# Patient Record
Sex: Male | Born: 1950 | Race: White | Hispanic: No | Marital: Married | Smoking: Never smoker
Health system: Southern US, Community
[De-identification: ages and names within clinical notes are randomized; demographics above are authoritative.]

## PROBLEM LIST (undated history)

## (undated) DIAGNOSIS — G4700 Insomnia, unspecified: Secondary | ICD-10-CM

## (undated) DIAGNOSIS — Z860100 Personal history of colon polyps, unspecified: Secondary | ICD-10-CM

## (undated) DIAGNOSIS — R238 Other skin changes: Secondary | ICD-10-CM

## (undated) DIAGNOSIS — T4145XA Adverse effect of unspecified anesthetic, initial encounter: Secondary | ICD-10-CM

## (undated) DIAGNOSIS — L719 Rosacea, unspecified: Secondary | ICD-10-CM

## (undated) DIAGNOSIS — T8859XA Other complications of anesthesia, initial encounter: Secondary | ICD-10-CM

## (undated) DIAGNOSIS — M549 Dorsalgia, unspecified: Secondary | ICD-10-CM

## (undated) DIAGNOSIS — E119 Type 2 diabetes mellitus without complications: Secondary | ICD-10-CM

## (undated) DIAGNOSIS — G629 Polyneuropathy, unspecified: Secondary | ICD-10-CM

## (undated) DIAGNOSIS — M1990 Unspecified osteoarthritis, unspecified site: Secondary | ICD-10-CM

## (undated) DIAGNOSIS — R233 Spontaneous ecchymoses: Secondary | ICD-10-CM

## (undated) DIAGNOSIS — R200 Anesthesia of skin: Secondary | ICD-10-CM

## (undated) DIAGNOSIS — E785 Hyperlipidemia, unspecified: Secondary | ICD-10-CM

## (undated) DIAGNOSIS — G8929 Other chronic pain: Secondary | ICD-10-CM

## (undated) HISTORY — PX: OTHER SURGICAL HISTORY: SHX169

## (undated) HISTORY — PX: VERTEBRAL CORPECTOMY: SHX398

## (undated) HISTORY — PX: WRIST SURGERY: SHX841

## (undated) HISTORY — PX: ESOPHAGOGASTRODUODENOSCOPY: SHX1529

## (undated) HISTORY — PX: BACK SURGERY: SHX140

## (undated) HISTORY — PX: POSTERIOR CERVICAL FUSION/FORAMINOTOMY: SHX5038

## (undated) HISTORY — PX: ANKLE SURGERY: SHX546

## (undated) HISTORY — PX: KNEE ARTHROSCOPY: SUR90

## (undated) HISTORY — PX: COLONOSCOPY: SHX174

## (undated) HISTORY — PX: CARPAL TUNNEL RELEASE: SHX101

---

## 1998-12-24 ENCOUNTER — Ambulatory Visit (HOSPITAL_COMMUNITY): Admission: RE | Admit: 1998-12-24 | Discharge: 1998-12-24 | Payer: Self-pay | Admitting: Neurology

## 1998-12-24 ENCOUNTER — Encounter: Payer: Self-pay | Admitting: Neurology

## 1998-12-25 ENCOUNTER — Encounter: Payer: Self-pay | Admitting: Neurology

## 1998-12-25 ENCOUNTER — Ambulatory Visit (HOSPITAL_COMMUNITY): Admission: RE | Admit: 1998-12-25 | Discharge: 1998-12-25 | Payer: Self-pay | Admitting: Neurology

## 1999-11-16 ENCOUNTER — Encounter: Payer: Self-pay | Admitting: Neurology

## 1999-11-16 ENCOUNTER — Ambulatory Visit (HOSPITAL_COMMUNITY): Admission: RE | Admit: 1999-11-16 | Discharge: 1999-11-16 | Payer: Self-pay | Admitting: Neurology

## 2006-11-23 ENCOUNTER — Inpatient Hospital Stay (HOSPITAL_COMMUNITY): Admission: AD | Admit: 2006-11-23 | Discharge: 2006-11-24 | Payer: Self-pay | Admitting: Orthopedic Surgery

## 2007-05-31 ENCOUNTER — Encounter: Admission: RE | Admit: 2007-05-31 | Discharge: 2007-05-31 | Payer: Self-pay | Admitting: Orthopedic Surgery

## 2007-06-19 ENCOUNTER — Encounter: Admission: RE | Admit: 2007-06-19 | Discharge: 2007-06-19 | Payer: Self-pay | Admitting: Orthopedic Surgery

## 2007-08-25 ENCOUNTER — Encounter: Admission: RE | Admit: 2007-08-25 | Discharge: 2007-08-25 | Payer: Self-pay | Admitting: Orthopedic Surgery

## 2007-12-05 ENCOUNTER — Encounter: Admission: RE | Admit: 2007-12-05 | Discharge: 2007-12-05 | Payer: Self-pay | Admitting: Orthopedic Surgery

## 2007-12-26 ENCOUNTER — Inpatient Hospital Stay (HOSPITAL_COMMUNITY): Admission: RE | Admit: 2007-12-26 | Discharge: 2007-12-29 | Payer: Self-pay | Admitting: Neurosurgery

## 2008-04-11 ENCOUNTER — Encounter: Admission: RE | Admit: 2008-04-11 | Discharge: 2008-04-11 | Payer: Self-pay | Admitting: Neurosurgery

## 2008-07-05 ENCOUNTER — Encounter: Admission: RE | Admit: 2008-07-05 | Discharge: 2008-07-05 | Payer: Self-pay | Admitting: Neurosurgery

## 2008-10-29 ENCOUNTER — Encounter: Admission: RE | Admit: 2008-10-29 | Discharge: 2008-10-29 | Payer: Self-pay | Admitting: Neurosurgery

## 2009-06-02 ENCOUNTER — Ambulatory Visit (HOSPITAL_COMMUNITY): Admission: RE | Admit: 2009-06-02 | Discharge: 2009-06-02 | Payer: Self-pay | Admitting: Neurosurgery

## 2009-12-16 ENCOUNTER — Inpatient Hospital Stay (HOSPITAL_COMMUNITY): Admission: RE | Admit: 2009-12-16 | Discharge: 2009-12-20 | Payer: Self-pay | Admitting: Neurosurgery

## 2010-06-26 IMAGING — CT CT CERVICAL SPINE W/O CM
4 of 5 series · 15 of 33 positions shown, 18 images · non-contrast
Comparison: 04/11/2008.

CLINICAL DATA: 57-year-old male with neck pain status post fusion
in December 2007.  Occasional right arm pain.

CT CERVICAL SPINE WITHOUT CONTRAST
TECHNIQUE: Multidetector CT imaging of the cervical spine was
performed. Multiplanar CT image reconstructions were also generated

[Series 4: bone windows · axial · 0.23mm/px · z∈[+127,+190]mm · 2 of 152 slices shown]
[im 51/152  bone]
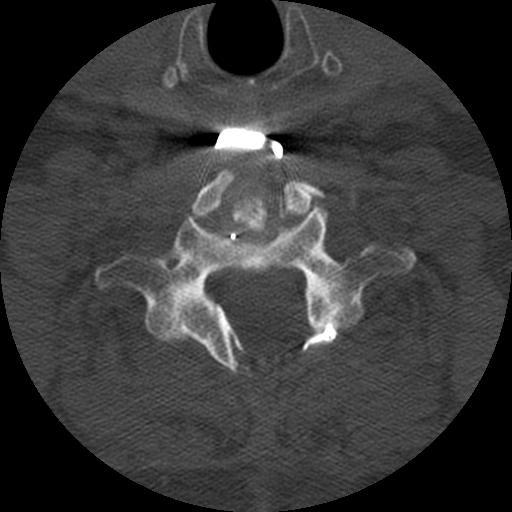
[im 101/152  bone]
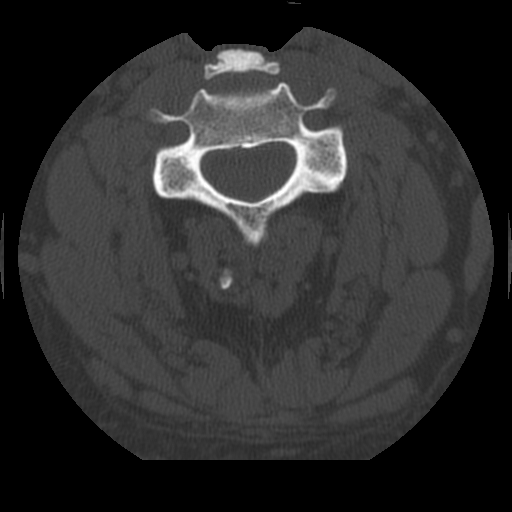

[Series 200: coronal · coronal · 0.38mm/px · 3 of 40 slices shown]
[im 8/40  bone]
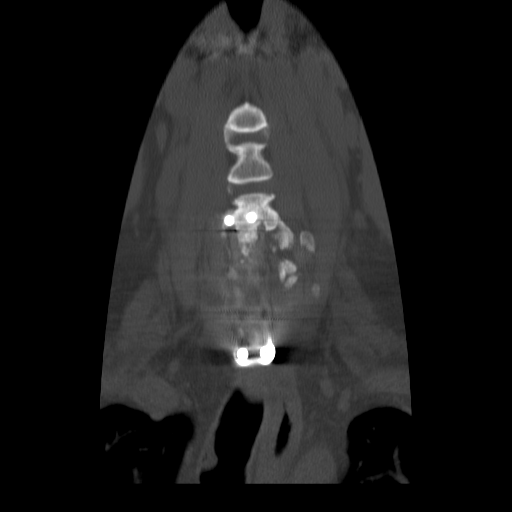
[im 16/40  bone]
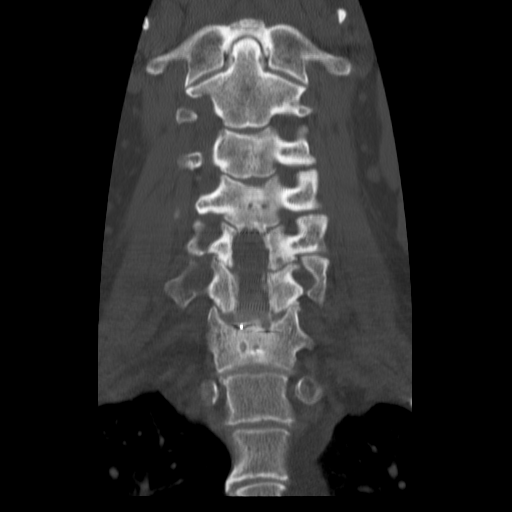
[im 24/40  bone]
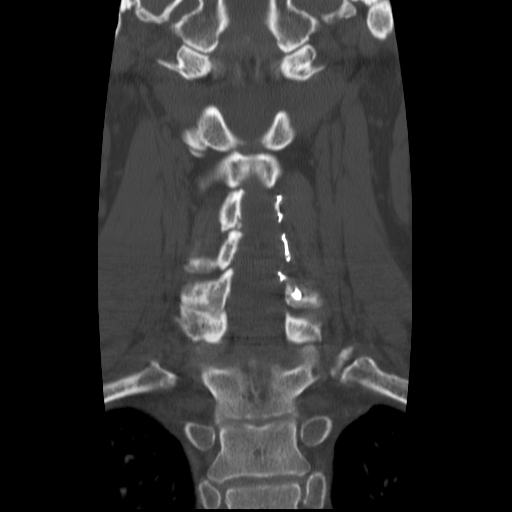

[Series 201: angled axial · axial · 0.20mm/px · z∈[+79,+199]mm · 5 of 272 slices shown, 7 images]
[im 46/272  soft-tissue]
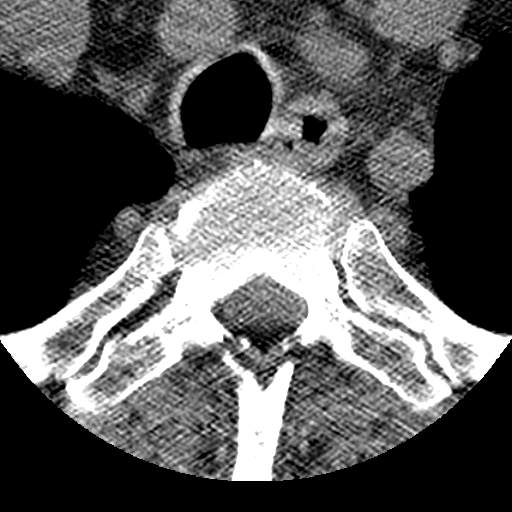
[im 46/272  bone]
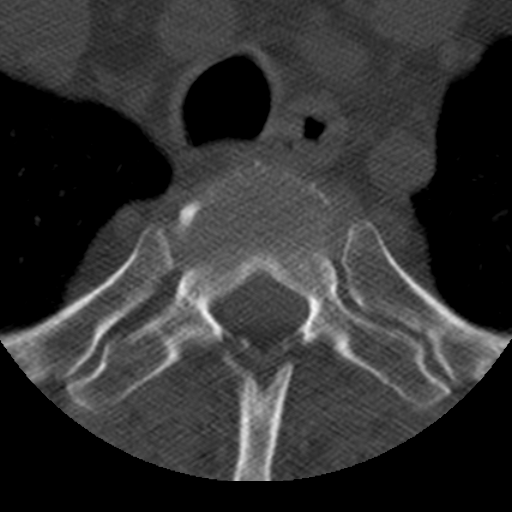
[im 91/272  bone]
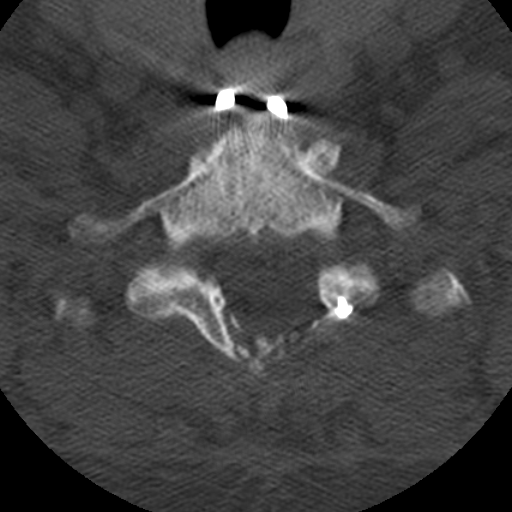
[im 136/272  bone]
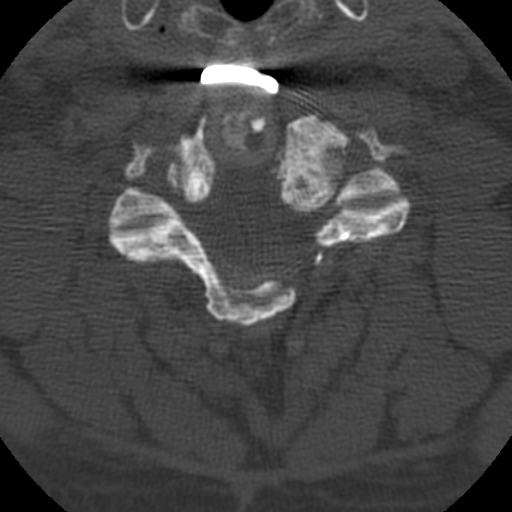
[im 181/272  bone]
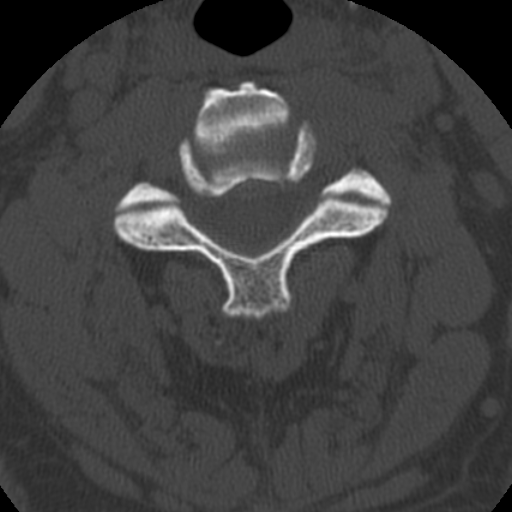
[im 226/272  soft-tissue]
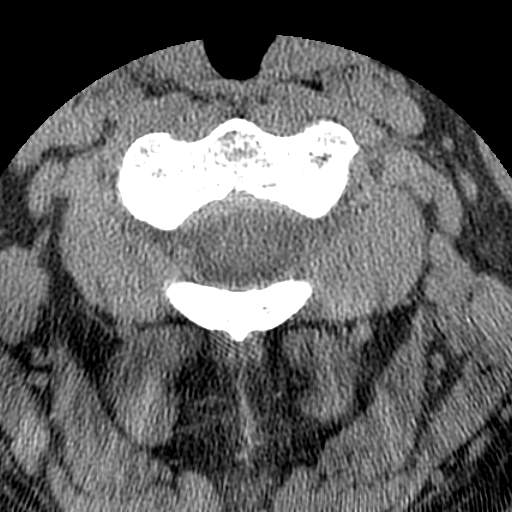
[im 226/272  bone]
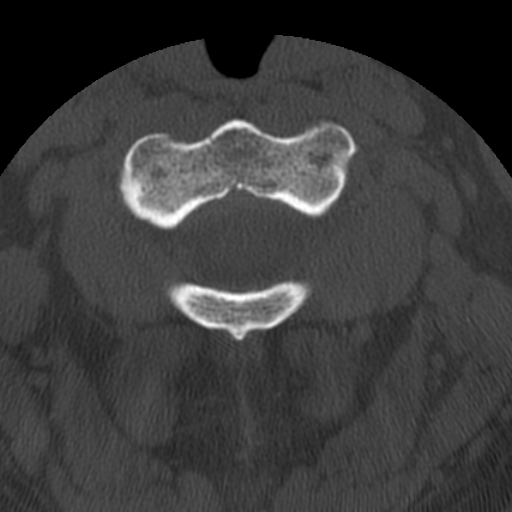

[Series 202: sagittal · sagittal · 0.38mm/px · 5 of 40 slices shown, 6 images]
[im 14/40  bone]
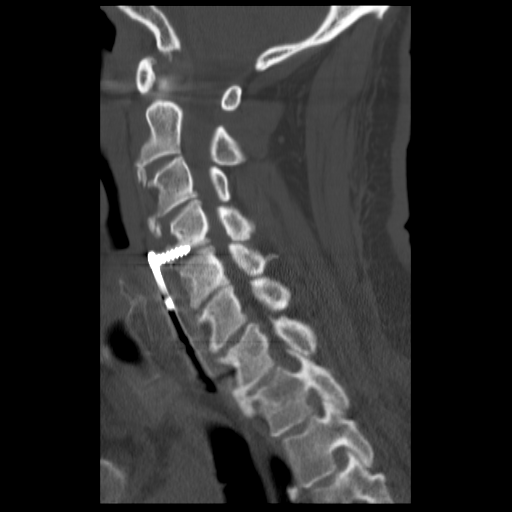
[im 17/40  bone]
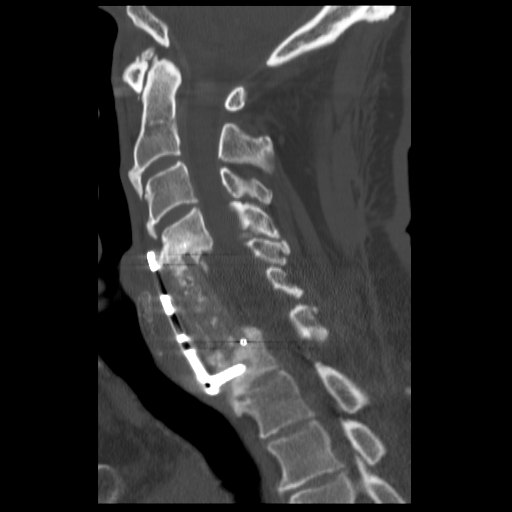
[im 20/40  soft-tissue]
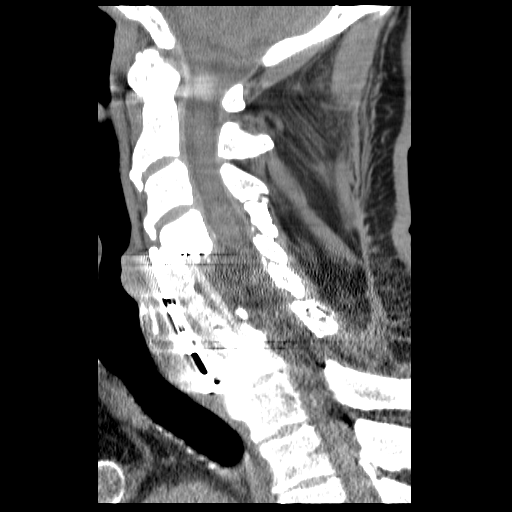
[im 20/40  bone]
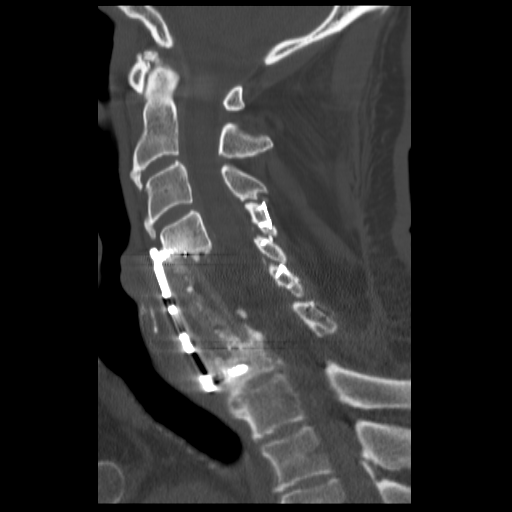
[im 23/40  bone]
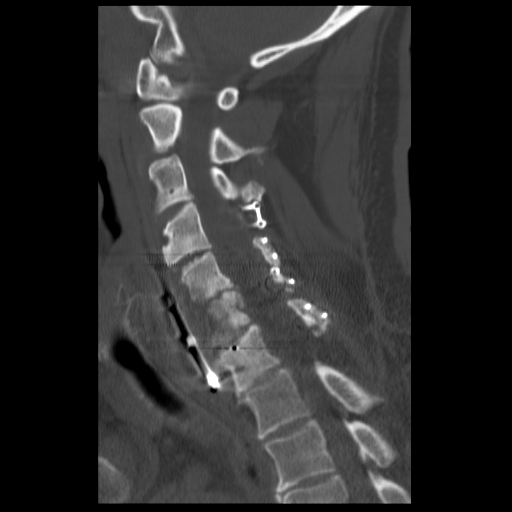
[im 27/40  bone]
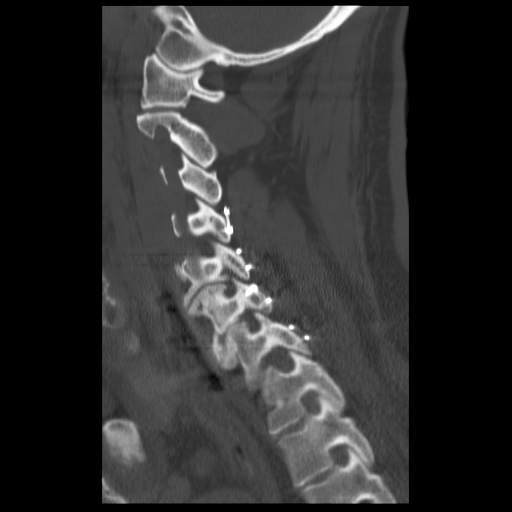

[15 of 33 positions shown; findings below may reference images not displayed]

FINDINGS: Sequelae of C5 and C6 corpectomy with anterior fusion
hardware C4-C7 re-identified.  Unchanged appearance of incompletely
bridging bone throughout the corpectomy space.  Stable alignment of
the cervicothoracic junction.  Stable posterior hardware placement
related to partial laminectomies from C3-C4 through C7.

Posterior elements are normally aligned and appear stable.
Visualized skull base appears intact with no atlanto-occipital
dissociation.  C1 and C2 are intact.  Postoperative changes to the
paraspinal soft tissues.  Visualized lung apices are clear.
Cervicothoracic junction is stable within normal limits.

C2-C3:  Stable disc protrusion, spinal canal and neural foraminal
patency without stenosis.

C3-C4:  Stable partial laminectomy.  Stable disc protrusion and
right greater than left uncovertebral hypertrophy.  Mild spinal
stenosis suspected (AP thecal sac 8.7 mm, unchanged).  Stable mild
right neural foraminal stenosis.

C4-C5:  Stable partial laminectomy.  Right C4 vertebral body
hardware appears stable and intact.  Right greater than left
uncovertebral hypertrophy re-identified with mild foraminal
stenosis.  Stable spinal canal patency.

C5-C6:  Status post corpectomy.  Stable moderate to severe left and
moderate right neural foraminal stenosis related to facet and
uncovertebral hypertrophy.  Stable spinal canal patency.

C6-C7:  Stable partial laminectomy corpectomy.  Stable mild
bilateral neural foraminal stenosis related to uncovertebral and
facet hypertrophy.  Stable C7 hardware with mild lucency at the
apex of both screws, right greater than left.

C7-T1:  Stable disc osteophyte complex and spinal canal patency.
Stable mild right foraminal stenosis related to facet hypertrophy.

T1-T2:  Stable, negative.

T2-T3:  Stable facet hypertrophy and ligament flavum hypertrophy
with calcification.  Mild bilateral, right greater than left neural
foraminal stenosis is stable.  Stable spinal canal patency.
IMPRESSION: 1.  Unchanged postoperative appearance of the cervical spine status
post C5, C6 corpectomy, C4 - C7 anterior fusion and partial
laminectomy with hardware C4-C5 through C6-C7 as detailed above.
2.  Unchanged lucency about the apex of the C7 cortical screws
right greater than left suggestive of loosening.
3.  Unchanged incomplete bridging bone throughout the corpectomy
space.

## 2010-09-24 ENCOUNTER — Encounter: Admission: RE | Admit: 2010-09-24 | Discharge: 2010-09-24 | Payer: Self-pay | Admitting: Neurosurgery

## 2010-12-27 ENCOUNTER — Encounter: Payer: Self-pay | Admitting: Orthopedic Surgery

## 2011-02-21 LAB — CBC
HCT: 44.2 % (ref 39.0–52.0)
Hemoglobin: 15 g/dL (ref 13.0–17.0)
MCHC: 34 g/dL (ref 30.0–36.0)
MCV: 87.3 fL (ref 78.0–100.0)
Platelets: 308 10*3/uL (ref 150–400)
RDW: 13.7 % (ref 11.5–15.5)
WBC: 9.6 10*3/uL (ref 4.0–10.5)

## 2011-02-21 LAB — GLUCOSE, CAPILLARY
Glucose-Capillary: 121 mg/dL — ABNORMAL HIGH (ref 70–99)
Glucose-Capillary: 131 mg/dL — ABNORMAL HIGH (ref 70–99)
Glucose-Capillary: 133 mg/dL — ABNORMAL HIGH (ref 70–99)
Glucose-Capillary: 134 mg/dL — ABNORMAL HIGH (ref 70–99)
Glucose-Capillary: 135 mg/dL — ABNORMAL HIGH (ref 70–99)
Glucose-Capillary: 139 mg/dL — ABNORMAL HIGH (ref 70–99)
Glucose-Capillary: 147 mg/dL — ABNORMAL HIGH (ref 70–99)
Glucose-Capillary: 151 mg/dL — ABNORMAL HIGH (ref 70–99)

## 2011-02-21 LAB — BASIC METABOLIC PANEL
BUN: 14 mg/dL (ref 6–23)
CO2: 28 mEq/L (ref 19–32)
Creatinine, Ser: 0.91 mg/dL (ref 0.4–1.5)
GFR calc Af Amer: >60 mL/min (ref >60)
Potassium: 4.7 mEq/L (ref 3.5–5.1)
Sodium: 137 mEq/L (ref 135–145)

## 2011-03-15 LAB — GLUCOSE, CAPILLARY
Glucose-Capillary: 145 mg/dL — ABNORMAL HIGH (ref 70–99)
Glucose-Capillary: 161 mg/dL — ABNORMAL HIGH (ref 70–99)

## 2011-04-20 NOTE — Op Note (Signed)
Jason Bernard, Jason Bernard             ACCOUNT NO.:  [id number]   MEDICAL RECORD NO.:  [id number]          PATIENT TYPE:  INP   LOCATION:  3101                         FACILITY:  [facility]   PHYSICIAN:  Danae Orleans. Venetia Maxon, M.D.  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   POSTOPERATIVE DIAGNOSIS:  Ossification of posterior longitudinal  ligament with cervical myelopathy, cervical stenosis and spondylosis  with herniated cervical disks at C4-5 through C6-7 levels.   PROCEDURE:  Anterior cervical corpectomy of C5 and C6 with 42 mm  anterior PEEK interbody corpectomy cage packed with morcellized bone  autograft and anterior cervical plate from Z6-X0 levels.   SURGEON:  Danae Orleans. Venetia Maxon, M.D.   ASSISTANT:  Georgiann Cocker, RN  Hilda Lias, M.D.   ANESTHESIA:  General endotracheal anesthesia.   BLOOD LOSS:  Three-hundred mL   COMPLICATIONS:  None.   DISPOSITION:  Recovery.   INDICATIONS:  Jason Bernard is a 60 year old man with significant  cervical myelopathy.  He has previously undergone a laminoplasty of C4-  C7 and has now developed a progressive significant cervical myelopathy  with evidence of cord compression on recent cervical myelography.  He  has ossification of posterior longitudinal ligament with compression of  spinal cord from C4-5 to C6-7 levels with a large osteophyte projecting  posteriorly into the spinal cord over the majority of C6 and also at C5-  6.  It was elected to take him to surgery for anterior cervical  corpectomy of C5 and C6 with anterior cervical plating C4-C7 levels.   PROCEDURE:  Jason Bernard is brought to the operating room.  Following  satisfactory and uncomplicated induction  of general endotracheal  anesthesia and placement of intravenous lines, the patient was placed in  supine position on the operating table.  His neck was placed in slight  extension.  He was placed in 10 pounds of halter traction.  His anterior  neck was then prepped and draped in the usual sterile fashion.  Incision  was made after injecting area of planned incision with 025% Marcaine and  0.5% lidocaine with 1:100,000 epinephrine for midline to the anterior  border sternocleidomastoid muscle and in the lowest neck crease.  This  was carried through platysma layer.  Subplatysmal dissection was  performed exposing the anterior border of the sternocleidomastoid  muscle.  Using blunt dissection the carotid sheath was kept lateral and  trachea and esophagus kept medial, exposing the anterior cervical spine.  Bent spinal needles were placed at what was felt to be the C4-5 and C5-6  levels and this was confirmed on intraoperative x-ray.  Subsequently the  longus colli muscles were taken down from the anterior cervical spine  from C4 to C7 bilaterally using electrocautery and Key elevator and  shadow line self-retaining retractors placed along with up and down  retractor.  Distraction pins were placed at  C4 and C7 levels and the  Caspar distraction device was then placed.  The interspaces were  incised, disk material was removed in piecemeal fashion and subsequently  the corpectomy was performed under loupe magnification with a high-speed  drill of a variety of discs of C5 and C6 vertebral bodies. The bone was  saved for later use with bone grafting with the suction trap.  Eventually the bone was thinned to a relatively thin layer of cortical  bone and subsequently the microscope was brought into the field and  using microdissection technique the posterior longitudinal ligament was  mobilized and incised and removed with a variety of 2-3 mm gold tip  Kerrison rongeurs.  The large ventral  osteophyte and what was felt to be  ossification of the posterior longitudinal ligament did in fact turn out  to be ossified ligament which was densely adherent to the dura.  This  was reduced.  The bone mass was reduced to as small as possible until it  very densely adhered to the dura and then subsequently on removal a  small exposure of arachnoid was made but no leakage of spinal fluid and  at this point it was elected to leave the remaining adherent piece of  bone and ligament as it was felt that it was likely that removal would  not more adequately decompress the spinal cord as this was now a  floating piece of bone but it was clearly densely adherent to the dura.  Hemostasis was then assured and diskectomy was performed at C6-7 and  also at C4-5 and the spinal cord dura was well decompressed.  Hemostasis  was then assured and after trial sizing a 42-mm PEEK corpectomy cage was  selected, packed with morcellized bone autograft and tamped into  position, countersunk appropriately.  A 51 mm anterior cervical plate  was then affixed to the anterior cervical spine after distraction pins  were removed and traction weight was removed.  All screws had excellent  purchase and locking mechanisms were engaged.  The trussel plate was  used with fixed angle 14 mm screws.  The upper aspect of the construct  was visualized on intraoperative x-ray.  Hemostasis was assured.  Soft  tissues inspected and found to be in good repair.  A #7 JP drain was  placed through a separate stab incision and it was anchored with nylon  stitch.  The platysma layer was closed with 3-0 Vicryl sutures.  Skin  edges were approximated with 3-0 Vicryl subcuticular stitch.  The wound  was dressed with Benzoin, Steri-Strips, Telfa gauze tape and sterile  occlusive dressing was placed.  The patient was extubated in the  operating room, taken to the recovery room in stable satisfactory  condition having tolerated his operation  well.  Counts were correct at  the end of the case.      Danae Orleans. Venetia Maxon, M.D.  Electronically Signed     JDS/MEDQ  D:  12/26/2007  T:  12/27/2007  Job:  [id number]

## 2011-04-23 NOTE — Discharge Summary (Signed)
[name], [name]             ACCOUNT NO.:  [id number]   MEDICAL RECORD NO.:  [id number]          PATIENT TYPE:  INP   LOCATION:  3006                         FACILITY:  [facility]   PHYSICIAN:  Danae Orleans. Venetia Maxon, M.D.  DATE OF BIRTH:  1951/05/01   DATE OF ADMISSION:  12/26/2007  DATE OF DISCHARGE:  12/29/2007                               DISCHARGE SUMMARY   REASON FOR ADMISSION:  1. Cervical spondylosis with myelopathy.  2. Cervical disk herniation and degeneration with myelopathy.  3. Ossification of cervical ligament.  4. Type 2 diabetes, long-term use of insulin.   FINAL DIAGNOSES:  1. Cervical spondylosis with myelopathy.  2. Cervical disk herniation and degeneration with myelopathy.  3. Ossification of cervical ligament.  4. Type 2 diabetes, long-term use of insulin.   HISTORY OF ILLNESS/HOSPITAL COURSE:  Jason Bernard is a 60 year old  man with significant cervical myelopathy.  He had previously undergone  decompression and posterior cervical laminoplasty from C4-C7 levels, now  comes in with anterior cord compression and deformation with a large  central and slightly leftward disk protrusion at C5-C6 with an inferior  calcified fragment and focal ossification of posterior longitudinal  ligament.  The patient has developed a significant progressive  myelopathy.  It was elected to admit him on same day as procedure basis  for anterior cervical corpectomy of C5-C6 vertebral bodies with anterior  P-cage placement and morselized bone autograft along with anterior  cervical plate from B1-Y7 levels.  The patient underwent uncomplicated  surgery and had a Jackson-Pratt drain which was removed on December 28, 2007 and was gradually mobilized.  Saw significant improvement in his  myelopathic symptoms after surgery and was discharged home on December 28, 2007.   DISCHARGE MEDICATIONS:  1. Percocet 10/325 1-2 every 4 as needed for pain, dispensed number      60.  2. Valium 10 mg  every 6-8 hours as need for muscular spasm, dispensed      number 60.   FAMILY HISTORY:  Followup in the office in 3 weeks.      Danae Orleans. Venetia Maxon, M.D.  Electronically Signed     JDS/MEDQ  D:  01/09/2008  T:  01/09/2008  Job:  [id number]

## 2011-08-26 LAB — BASIC METABOLIC PANEL
CO2: 25
Calcium: 9.8
Creatinine, Ser: 0.79
GFR calc Af Amer: >60
GFR calc non Af Amer: >60
Sodium: 136

## 2011-08-26 LAB — CBC
Hemoglobin: 16.5
MCHC: 34.5
RBC: 5.43
WBC: 11.8 — ABNORMAL HIGH

## 2014-01-10 ENCOUNTER — Other Ambulatory Visit: Payer: Self-pay | Admitting: Neurosurgery
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
## 2014-01-23 ENCOUNTER — Encounter (HOSPITAL_COMMUNITY): Payer: Self-pay | Admitting: Pharmacy Technician

## 2014-01-24 ENCOUNTER — Encounter (HOSPITAL_COMMUNITY)
Admission: RE | Admit: 2014-01-24 | Discharge: 2014-01-24 | Disposition: A | Discharge status: Home / Self Care | Payer: BC Managed Care – PPO | Source: Ambulatory Visit | Attending: Neurosurgery | Admitting: Neurosurgery

## 2014-01-24 ENCOUNTER — Encounter (HOSPITAL_COMMUNITY): Payer: Self-pay

## 2014-01-24 ENCOUNTER — Encounter (HOSPITAL_COMMUNITY)
Admission: RE | Admit: 2014-01-24 | Discharge: 2014-01-24 | Disposition: A | Discharge status: Home / Self Care | Payer: BC Managed Care – PPO | Source: Ambulatory Visit | Attending: Anesthesiology | Admitting: Anesthesiology

## 2014-01-24 DIAGNOSIS — Z01812 Encounter for preprocedural laboratory examination: Secondary | ICD-10-CM | POA: Insufficient documentation

## 2014-01-24 DIAGNOSIS — Z01818 Encounter for other preprocedural examination: Secondary | ICD-10-CM | POA: Insufficient documentation

## 2014-01-24 DIAGNOSIS — Z01810 Encounter for preprocedural cardiovascular examination: Secondary | ICD-10-CM | POA: Insufficient documentation

## 2014-01-24 HISTORY — DX: Other complications of anesthesia, initial encounter: T88.59XA

## 2014-01-24 HISTORY — DX: Polyneuropathy, unspecified: G62.9

## 2014-01-24 HISTORY — DX: Adverse effect of unspecified anesthetic, initial encounter: T41.45XA

## 2014-01-24 HISTORY — DX: Spontaneous ecchymoses: R23.3

## 2014-01-24 HISTORY — DX: Rosacea, unspecified: L71.9

## 2014-01-24 HISTORY — DX: Anesthesia of skin: R20.0

## 2014-01-24 HISTORY — DX: Hyperlipidemia, unspecified: E78.5

## 2014-01-24 HISTORY — DX: Personal history of colonic polyps: Z86.010

## 2014-01-24 HISTORY — DX: Other chronic pain: G89.29

## 2014-01-24 HISTORY — DX: Type 2 diabetes mellitus without complications: E11.9

## 2014-01-24 HISTORY — DX: Insomnia, unspecified: G47.00

## 2014-01-24 HISTORY — DX: Unspecified osteoarthritis, unspecified site: M19.90

## 2014-01-24 HISTORY — DX: Personal history of colon polyps, unspecified: Z86.0100

## 2014-01-24 HISTORY — DX: Dorsalgia, unspecified: M54.9

## 2014-01-24 HISTORY — DX: Other skin changes: R23.8

## 2014-01-24 LAB — TYPE AND SCREEN
ABO/RH(D): O POS
Antibody Screen: NEGATIVE

## 2014-01-24 LAB — CBC
HEMATOCRIT: 51.1 % (ref 39.0–52.0)
HEMOGLOBIN: 17.4 g/dL — AB (ref 13.0–17.0)
MCH: 30.9 pg (ref 26.0–34.0)
MCHC: 34.1 g/dL (ref 30.0–36.0)
MCV: 90.8 fL (ref 78.0–100.0)
Platelets: 258 10*3/uL (ref 150–400)
RBC: 5.63 MIL/uL (ref 4.22–5.81)
RDW: 13.7 % (ref 11.5–15.5)
WBC: 10.7 10*3/uL — ABNORMAL HIGH (ref 4.0–10.5)

## 2014-01-24 LAB — BASIC METABOLIC PANEL
BUN: 6 mg/dL (ref 6–23)
CHLORIDE: 98 meq/L (ref 96–112)
CO2: 31 mEq/L (ref 19–32)
Calcium: 9.8 mg/dL (ref 8.4–10.5)
Creatinine, Ser: 0.81 mg/dL (ref 0.50–1.35)
GFR calc Af Amer: >90 mL/min (ref >90)
GLUCOSE: 145 mg/dL — AB (ref 70–99)
POTASSIUM: 4.3 meq/L (ref 3.7–5.3)
Sodium: 138 mEq/L (ref 137–147)

## 2014-01-24 LAB — SURGICAL PCR SCREEN
MRSA, PCR: NEGATIVE
Staphylococcus aureus: NEGATIVE

## 2014-01-24 NOTE — Progress Notes (Signed)
Takes Lisinopril to protect kidneys but not for HTN

## 2014-01-24 NOTE — Progress Notes (Signed)
Fasting blood sugar runs around 80 

## 2014-01-24 NOTE — Pre-Procedure Instructions (Signed)
Jason Bernard  01/24/2014   Your procedure is scheduled on:  Fri, Feb 27 @ 1:00 PM  [facility]  at 10:00 AM.  Call this number if you have problems the morning of surgery: [phone]   Remember:   Do not eat food or drink liquids after midnight.   Take these medicines the morning of surgery with A SIP OF WATER: Tramadol(Ultram-if needed)               No Goody's,BC's,Aleve,Ibuprofen,Aspirin,Fish Oil,or any Herbal Medications   Do not wear jewelry  Do not wear lotions, powders, or colognes. You may wear deodorant.  Men may shave face and neck.  Do not bring valuables to the hospital.  [facility] is not responsible                  for any belongings or valuables.               Contacts, dentures or bridgework may not be worn into surgery.  Leave suitcase in the car. After surgery it may be brought to your room.  For patients admitted to the hospital, discharge time is determined by your                treatment team.                 Special Instructions:  [place] - Preparing for Surgery  Before surgery, you can play an important role.  Because skin is not sterile, your skin needs to be as free of germs as possible.  You can reduce the number of germs on you skin by washing with CHG (chlorahexidine gluconate) soap before surgery.  CHG is an antiseptic cleaner which kills germs and bonds with the skin to continue killing germs even after washing.  Please DO NOT use if you have an allergy to CHG or antibacterial soaps.  If your skin becomes reddened/irritated stop using the CHG and inform your nurse when you arrive at Short Stay.  Do not shave (including legs and underarms) for at least 48 hours prior to the first CHG shower.  You may shave your face.  Please follow these instructions carefully:   1.  Shower with CHG Soap the night before surgery and the                                morning of Surgery.  2.  If you choose to wash your hair,  wash your hair first as usual with your       normal shampoo.  3.  After you shampoo, rinse your hair and body thoroughly to remove the                      Shampoo.  4.  Use CHG as you would any other liquid soap.  You can apply chg directly       to the skin and wash gently with scrungie or a clean washcloth.  5.  Apply the CHG Soap to your body ONLY FROM THE NECK DOWN.        Do not use on open wounds or open sores.  Avoid contact with your eyes,       ears, mouth and genitals (private parts).  Wash genitals (private parts)       with your normal soap.  6.  Wash thoroughly,  paying special attention to the area where your surgery        will be performed.  7.  Thoroughly rinse your body with warm water from the neck down.  8.  DO NOT shower/wash with your normal soap after using and rinsing off       the CHG Soap.  9.  Pat yourself dry with a clean towel.            10.  Wear clean pajamas.            11.  Place clean sheets on your bed the night of your first shower and do not        sleep with pets.  Day of Surgery  Do not apply any lotions/deoderants the morning of surgery.  Please wear clean clothes to the hospital/surgery center.     Please read over the following fact sheets that you were given: Pain Booklet, Coughing and Deep Breathing, Blood Transfusion Information, MRSA Information and Surgical Site Infection Prevention

## 2014-01-24 NOTE — Progress Notes (Signed)
Called and left a message that Ancef 2gm is ordered but pt has a PCN allergy-awaiting to hear back

## 2014-01-24 NOTE — Progress Notes (Addendum)
Pt doesn't have a cardiologist  Denies ever having an echo/stress test/heart cath  Denies ekg or cxr in past yr    Medical Md is Dr.Michael Badger  Sleep study done about 6147yrs ago-mild case but no cpap required

## 2014-01-25 NOTE — Progress Notes (Signed)
Anesthesia Chart Review:  Patient is a 63 year old male scheduled for L3-4, L5-S1 PLIF on 227/15 by Dr. Venetia MaxonStern.  History includes non-smoker, HLD, DM2, peripheral neuropathy, rosacea, bruises easily, chronic back pain, arthritis, multiple surgeries including lumbar fusion '11 and c-spine fusion '09. PCP is Dr. Cyndia BentBadger.  He denied prior cardiac testing.  EKG on 01/24/14 showed NSR, cannot rule out anterior infarct (age undetermined).  I think his EKG appears stable since at least 12/10/09.  Preoperative CXR and labs noted.  If no acute changes then I anticipate that he can proceed as planned.  Velna Ochsllison Zelenak, PA-C [facility]/Anesthesiology Phone [phone] 01/25/2014 12:40 PM

## 2014-01-31 MED ORDER — VANCOMYCIN HCL IN DEXTROSE 1-5 GM/200ML-% IV SOLN
1000.0000 mg | Freq: Once | INTRAVENOUS | Status: AC
  Administered 2014-02-01: 1000 mg via INTRAVENOUS
  Filled 2014-01-31: qty 200

## 2014-02-01 ENCOUNTER — Encounter (HOSPITAL_COMMUNITY): Payer: BC Managed Care – PPO | Admitting: Vascular Surgery

## 2014-02-01 ENCOUNTER — Inpatient Hospital Stay (HOSPITAL_COMMUNITY): Payer: BC Managed Care – PPO

## 2014-02-01 ENCOUNTER — Inpatient Hospital Stay (HOSPITAL_COMMUNITY): Payer: BC Managed Care – PPO | Admitting: Certified Registered Nurse Anesthetist

## 2014-02-01 ENCOUNTER — Inpatient Hospital Stay (HOSPITAL_COMMUNITY)
Admission: RE | Admit: 2014-02-01 | Discharge: 2014-02-05 | DRG: 460 | Disposition: A | Discharge status: Home Health | Payer: BC Managed Care – PPO | Source: Ambulatory Visit | Attending: Neurosurgery | Admitting: Neurosurgery

## 2014-02-01 ENCOUNTER — Encounter (HOSPITAL_COMMUNITY): Payer: Self-pay | Admitting: *Deleted

## 2014-02-01 ENCOUNTER — Encounter (HOSPITAL_COMMUNITY)
Admission: RE | Disposition: A | Discharge status: Home Health | Payer: BC Managed Care – PPO | Source: Ambulatory Visit | Attending: Neurosurgery

## 2014-02-01 DIAGNOSIS — E119 Type 2 diabetes mellitus without complications: Secondary | ICD-10-CM | POA: Diagnosis present

## 2014-02-01 DIAGNOSIS — M48061 Spinal stenosis, lumbar region without neurogenic claudication: Secondary | ICD-10-CM | POA: Diagnosis present

## 2014-02-01 DIAGNOSIS — M5137 Other intervertebral disc degeneration, lumbosacral region: Secondary | ICD-10-CM | POA: Diagnosis present

## 2014-02-01 DIAGNOSIS — M51379 Other intervertebral disc degeneration, lumbosacral region without mention of lumbar back pain or lower extremity pain: Secondary | ICD-10-CM | POA: Diagnosis present

## 2014-02-01 DIAGNOSIS — M4310 Spondylolisthesis, site unspecified: Principal | ICD-10-CM | POA: Diagnosis present

## 2014-02-01 DIAGNOSIS — Z794 Long term (current) use of insulin: Secondary | ICD-10-CM

## 2014-02-01 DIAGNOSIS — M4716 Other spondylosis with myelopathy, lumbar region: Secondary | ICD-10-CM | POA: Diagnosis present

## 2014-02-01 DIAGNOSIS — I10 Essential (primary) hypertension: Secondary | ICD-10-CM | POA: Diagnosis present

## 2014-02-01 DIAGNOSIS — Z79899 Other long term (current) drug therapy: Secondary | ICD-10-CM

## 2014-02-01 LAB — GLUCOSE, CAPILLARY
GLUCOSE-CAPILLARY: 220 mg/dL — AB (ref 70–99)
Glucose-Capillary: 114 mg/dL — ABNORMAL HIGH (ref 70–99)
Glucose-Capillary: 164 mg/dL — ABNORMAL HIGH (ref 70–99)
Glucose-Capillary: 164 mg/dL — ABNORMAL HIGH (ref 70–99)

## 2014-02-01 SURGERY — POSTERIOR LUMBAR FUSION 2 LEVEL
Anesthesia: General | Site: Back

## 2014-02-01 MED ORDER — SODIUM CHLORIDE 0.9 % IV SOLN
INTRAVENOUS | Status: DC | PRN
  Administered 2014-02-01: 12:00:00 via INTRAVENOUS

## 2014-02-01 MED ORDER — OXYCODONE HCL 5 MG PO TABS
5.0000 mg | ORAL_TABLET | Freq: Once | ORAL | Status: AC | PRN
  Administered 2014-02-01: 5 mg via ORAL

## 2014-02-01 MED ORDER — FENTANYL CITRATE 0.05 MG/ML IJ SOLN
INTRAMUSCULAR | Status: AC
  Filled 2014-02-01: qty 5

## 2014-02-01 MED ORDER — ARTIFICIAL TEARS OP OINT
TOPICAL_OINTMENT | OPHTHALMIC | Status: AC
  Filled 2014-02-01: qty 3.5

## 2014-02-01 MED ORDER — NEOSTIGMINE METHYLSULFATE 1 MG/ML IJ SOLN
INTRAMUSCULAR | Status: AC
  Filled 2014-02-01: qty 10

## 2014-02-01 MED ORDER — ROCURONIUM BROMIDE 50 MG/5ML IV SOLN
INTRAVENOUS | Status: AC
  Filled 2014-02-01: qty 1

## 2014-02-01 MED ORDER — EPHEDRINE SULFATE 50 MG/ML IJ SOLN
INTRAMUSCULAR | Status: AC
  Filled 2014-02-01: qty 1

## 2014-02-01 MED ORDER — PROPOFOL 10 MG/ML IV BOLUS
INTRAVENOUS | Status: DC | PRN
  Administered 2014-02-01: 150 mg via INTRAVENOUS

## 2014-02-01 MED ORDER — PROMETHAZINE HCL 25 MG/ML IJ SOLN
12.5000 mg | Freq: Four times a day (QID) | INTRAMUSCULAR | Status: DC | PRN
  Administered 2014-02-01 – 2014-02-02 (×2): 12.5 mg via INTRAVENOUS
  Administered 2014-02-02: 25 mg via INTRAVENOUS
  Administered 2014-02-02: 12.5 mg via INTRAVENOUS
  Administered 2014-02-02 – 2014-02-03 (×2): 25 mg via INTRAVENOUS
  Filled 2014-02-01 (×8): qty 1

## 2014-02-01 MED ORDER — NEOSTIGMINE METHYLSULFATE 1 MG/ML IJ SOLN
INTRAMUSCULAR | Status: DC | PRN
  Administered 2014-02-01: 5 mg via INTRAVENOUS

## 2014-02-01 MED ORDER — PHENYLEPHRINE HCL 10 MG/ML IJ SOLN
INTRAMUSCULAR | Status: DC | PRN
  Administered 2014-02-01 (×2): 80 ug via INTRAVENOUS
  Administered 2014-02-01 (×2): 40 ug via INTRAVENOUS
  Administered 2014-02-01: 80 ug via INTRAVENOUS
  Administered 2014-02-01: 40 ug via INTRAVENOUS

## 2014-02-01 MED ORDER — SODIUM CHLORIDE 0.9 % IJ SOLN: 3.0000 mL | INTRAMUSCULAR | Status: DC | PRN

## 2014-02-01 MED ORDER — BUPIVACAINE HCL (PF) 0.5 % IJ SOLN
INTRAMUSCULAR | Status: DC | PRN
  Administered 2014-02-01: 10 mL

## 2014-02-01 MED ORDER — MEPERIDINE HCL 25 MG/ML IJ SOLN: 6.2500 mg | INTRAMUSCULAR | Status: DC | PRN

## 2014-02-01 MED ORDER — LIDOCAINE HCL (CARDIAC) 20 MG/ML IV SOLN
INTRAVENOUS | Status: AC
Start: 2014-02-01 — End: 2014-02-01
  Filled 2014-02-01: qty 5

## 2014-02-01 MED ORDER — PANTOPRAZOLE SODIUM 40 MG IV SOLR
40.0000 mg | Freq: Every day | INTRAVENOUS | Status: DC
  Administered 2014-02-01 – 2014-02-02 (×2): 40 mg via INTRAVENOUS
  Filled 2014-02-01 (×3): qty 40

## 2014-02-01 MED ORDER — TRAMADOL HCL 50 MG PO TABS: 100.0000 mg | ORAL_TABLET | Freq: Four times a day (QID) | ORAL | Status: DC | PRN

## 2014-02-01 MED ORDER — DIAZEPAM 5 MG PO TABS
5.0000 mg | ORAL_TABLET | Freq: Four times a day (QID) | ORAL | Status: DC | PRN
  Administered 2014-02-01 – 2014-02-05 (×11): 5 mg via ORAL
  Filled 2014-02-01 (×10): qty 1

## 2014-02-01 MED ORDER — BACLOFEN 10 MG PO TABS
10.0000 mg | ORAL_TABLET | Freq: Three times a day (TID) | ORAL | Status: DC
  Filled 2014-02-01 (×2): qty 2

## 2014-02-01 MED ORDER — GLYCOPYRROLATE 0.2 MG/ML IJ SOLN
INTRAMUSCULAR | Status: DC | PRN
  Administered 2014-02-01: .8 mg via INTRAVENOUS

## 2014-02-01 MED ORDER — EPHEDRINE SULFATE 50 MG/ML IJ SOLN
INTRAMUSCULAR | Status: DC | PRN
  Administered 2014-02-01 (×5): 10 mg via INTRAVENOUS

## 2014-02-01 MED ORDER — ARTIFICIAL TEARS OP OINT
TOPICAL_OINTMENT | OPHTHALMIC | Status: DC | PRN
  Administered 2014-02-01: 1 via OPHTHALMIC

## 2014-02-01 MED ORDER — STERILE WATER FOR INJECTION IJ SOLN
INTRAMUSCULAR | Status: AC
  Filled 2014-02-01: qty 10

## 2014-02-01 MED ORDER — ALUM & MAG HYDROXIDE-SIMETH 200-200-20 MG/5ML PO SUSP: 30.0000 mL | Freq: Four times a day (QID) | ORAL | Status: DC | PRN

## 2014-02-01 MED ORDER — SODIUM CHLORIDE 0.9 % IJ SOLN
3.0000 mL | Freq: Two times a day (BID) | INTRAMUSCULAR | Status: DC
  Administered 2014-02-03 – 2014-02-04 (×3): 3 mL via INTRAVENOUS

## 2014-02-01 MED ORDER — ROCURONIUM BROMIDE 100 MG/10ML IV SOLN
INTRAVENOUS | Status: DC | PRN
  Administered 2014-02-01 (×2): 10 mg via INTRAVENOUS
  Administered 2014-02-01: 50 mg via INTRAVENOUS
  Administered 2014-02-01: 20 mg via INTRAVENOUS

## 2014-02-01 MED ORDER — TRAZODONE HCL 50 MG PO TABS
50.0000 mg | ORAL_TABLET | Freq: Every day | ORAL | Status: DC
  Administered 2014-02-01 – 2014-02-04 (×3): 50 mg via ORAL
  Filled 2014-02-01 (×5): qty 1

## 2014-02-01 MED ORDER — PROPOFOL 10 MG/ML IV BOLUS
INTRAVENOUS | Status: AC
  Filled 2014-02-01: qty 20

## 2014-02-01 MED ORDER — INSULIN ASPART 100 UNIT/ML ~~LOC~~ SOLN
0.0000 [IU] | Freq: Three times a day (TID) | SUBCUTANEOUS | Status: DC
  Administered 2014-02-01: 5 [IU] via SUBCUTANEOUS
  Administered 2014-02-01 – 2014-02-02 (×2): 3 [IU] via SUBCUTANEOUS
  Administered 2014-02-02: 2 [IU] via SUBCUTANEOUS
  Administered 2014-02-02: 3 [IU] via SUBCUTANEOUS
  Administered 2014-02-03 – 2014-02-05 (×5): 2 [IU] via SUBCUTANEOUS
  Administered 2014-02-05: 12:00:00 via SUBCUTANEOUS

## 2014-02-01 MED ORDER — FLEET ENEMA 7-19 GM/118ML RE ENEM: 1.0000 | ENEMA | Freq: Once | RECTAL | Status: AC | PRN

## 2014-02-01 MED ORDER — ATORVASTATIN CALCIUM 40 MG PO TABS
40.0000 mg | ORAL_TABLET | Freq: Every day | ORAL | Status: DC
  Administered 2014-02-01 – 2014-02-04 (×3): 40 mg via ORAL
  Filled 2014-02-01 (×5): qty 1

## 2014-02-01 MED ORDER — HYDROMORPHONE HCL PF 1 MG/ML IJ SOLN
INTRAMUSCULAR | Status: AC
  Filled 2014-02-01: qty 1

## 2014-02-01 MED ORDER — BACLOFEN 10 MG PO TABS
10.0000 mg | ORAL_TABLET | ORAL | Status: DC
  Administered 2014-02-01 – 2014-02-05 (×9): 10 mg via ORAL
  Filled 2014-02-01 (×12): qty 1

## 2014-02-01 MED ORDER — KCL IN DEXTROSE-NACL 20-5-0.45 MEQ/L-%-% IV SOLN
INTRAVENOUS | Status: DC
  Administered 2014-02-01 – 2014-02-02 (×2): via INTRAVENOUS
  Administered 2014-02-02: 950 mL via INTRAVENOUS
  Filled 2014-02-01 (×10): qty 1000

## 2014-02-01 MED ORDER — SUCCINYLCHOLINE CHLORIDE 20 MG/ML IJ SOLN
INTRAMUSCULAR | Status: AC
  Filled 2014-02-01: qty 1

## 2014-02-01 MED ORDER — ONDANSETRON HCL 4 MG/2ML IJ SOLN
4.0000 mg | INTRAMUSCULAR | Status: DC | PRN
  Administered 2014-02-01 – 2014-02-03 (×5): 4 mg via INTRAVENOUS
  Filled 2014-02-01 (×6): qty 2

## 2014-02-01 MED ORDER — ALBUMIN HUMAN 5 % IV SOLN
INTRAVENOUS | Status: DC | PRN
  Administered 2014-02-01: 10:00:00 via INTRAVENOUS

## 2014-02-01 MED ORDER — HYDROMORPHONE HCL PF 1 MG/ML IJ SOLN
0.2500 mg | INTRAMUSCULAR | Status: DC | PRN
  Administered 2014-02-01 (×4): 0.5 mg via INTRAVENOUS

## 2014-02-01 MED ORDER — MENTHOL 3 MG MT LOZG: 1.0000 | LOZENGE | OROMUCOSAL | Status: DC | PRN

## 2014-02-01 MED ORDER — 0.9 % SODIUM CHLORIDE (POUR BTL) OPTIME
TOPICAL | Status: DC | PRN
  Administered 2014-02-01: 1000 mL

## 2014-02-01 MED ORDER — OXYCODONE-ACETAMINOPHEN 5-325 MG PO TABS
1.0000 | ORAL_TABLET | ORAL | Status: DC | PRN
  Administered 2014-02-01 – 2014-02-02 (×4): 2 via ORAL
  Filled 2014-02-01 (×4): qty 2

## 2014-02-01 MED ORDER — MIDAZOLAM HCL 2 MG/2ML IJ SOLN
INTRAMUSCULAR | Status: AC
  Filled 2014-02-01: qty 2

## 2014-02-01 MED ORDER — BISACODYL 10 MG RE SUPP
10.0000 mg | Freq: Every day | RECTAL | Status: DC | PRN
  Administered 2014-02-05: 10 mg via RECTAL
  Filled 2014-02-01: qty 1

## 2014-02-01 MED ORDER — ACETAMINOPHEN 325 MG PO TABS: 650.0000 mg | ORAL_TABLET | ORAL | Status: DC | PRN

## 2014-02-01 MED ORDER — LISINOPRIL 5 MG PO TABS
5.0000 mg | ORAL_TABLET | Freq: Every day | ORAL | Status: DC
  Administered 2014-02-01 – 2014-02-04 (×3): 5 mg via ORAL
  Filled 2014-02-01 (×5): qty 1

## 2014-02-01 MED ORDER — THROMBIN 20000 UNITS EX SOLR
CUTANEOUS | Status: DC | PRN
  Administered 2014-02-01: 08:00:00 via TOPICAL

## 2014-02-01 MED ORDER — SENNA 8.6 MG PO TABS
1.0000 | ORAL_TABLET | Freq: Two times a day (BID) | ORAL | Status: DC
  Administered 2014-02-01 – 2014-02-05 (×7): 8.6 mg via ORAL
  Filled 2014-02-01 (×11): qty 1

## 2014-02-01 MED ORDER — HYDROMORPHONE HCL PF 1 MG/ML IJ SOLN
0.2500 mg | INTRAMUSCULAR | Status: DC | PRN
  Administered 2014-02-01 (×2): 0.5 mg via INTRAVENOUS

## 2014-02-01 MED ORDER — GLYCOPYRROLATE 0.2 MG/ML IJ SOLN
INTRAMUSCULAR | Status: AC
  Filled 2014-02-01: qty 4

## 2014-02-01 MED ORDER — ONDANSETRON HCL 4 MG/2ML IJ SOLN
INTRAMUSCULAR | Status: AC
  Filled 2014-02-01: qty 2

## 2014-02-01 MED ORDER — VANCOMYCIN HCL IN DEXTROSE 1-5 GM/200ML-% IV SOLN
INTRAVENOUS | Status: AC
  Filled 2014-02-01: qty 200

## 2014-02-01 MED ORDER — MORPHINE SULFATE 2 MG/ML IJ SOLN
1.0000 mg | INTRAMUSCULAR | Status: DC | PRN
  Administered 2014-02-01 – 2014-02-02 (×5): 2 mg via INTRAVENOUS
  Administered 2014-02-02: 4 mg via INTRAVENOUS
  Administered 2014-02-02 – 2014-02-03 (×4): 2 mg via INTRAVENOUS
  Filled 2014-02-01 (×5): qty 1
  Filled 2014-02-01: qty 2
  Filled 2014-02-01 (×4): qty 1

## 2014-02-01 MED ORDER — ACETAMINOPHEN 650 MG RE SUPP: 650.0000 mg | RECTAL | Status: DC | PRN

## 2014-02-01 MED ORDER — POLYETHYLENE GLYCOL 3350 17 G PO PACK
17.0000 g | PACK | Freq: Every day | ORAL | Status: DC | PRN
  Filled 2014-02-01: qty 1

## 2014-02-01 MED ORDER — BACLOFEN 20 MG PO TABS
20.0000 mg | ORAL_TABLET | Freq: Every day | ORAL | Status: DC
  Administered 2014-02-01 – 2014-02-04 (×4): 20 mg via ORAL
  Filled 2014-02-01 (×5): qty 1

## 2014-02-01 MED ORDER — FENTANYL CITRATE 0.05 MG/ML IJ SOLN
INTRAMUSCULAR | Status: DC | PRN
  Administered 2014-02-01 (×2): 50 ug via INTRAVENOUS
  Administered 2014-02-01: 150 ug via INTRAVENOUS
  Administered 2014-02-01 (×3): 50 ug via INTRAVENOUS

## 2014-02-01 MED ORDER — PHENYLEPHRINE HCL 10 MG/ML IJ SOLN
10.0000 mg | INTRAVENOUS | Status: DC | PRN
  Administered 2014-02-01: 15 ug/min via INTRAVENOUS

## 2014-02-01 MED ORDER — INSULIN ASPART 100 UNIT/ML ~~LOC~~ SOLN: 0.0000 [IU] | Freq: Every day | SUBCUTANEOUS | Status: DC

## 2014-02-01 MED ORDER — PHENOL 1.4 % MT LIQD: 1.0000 | OROMUCOSAL | Status: DC | PRN

## 2014-02-01 MED ORDER — LIDOCAINE HCL (CARDIAC) 20 MG/ML IV SOLN
INTRAVENOUS | Status: DC | PRN
  Administered 2014-02-01: 100 mg via INTRAVENOUS

## 2014-02-01 MED ORDER — ONDANSETRON HCL 4 MG/2ML IJ SOLN
INTRAMUSCULAR | Status: DC | PRN
  Administered 2014-02-01: 4 mg via INTRAVENOUS

## 2014-02-01 MED ORDER — ONDANSETRON HCL 4 MG/2ML IJ SOLN: 4.0000 mg | Freq: Once | INTRAMUSCULAR | Status: DC | PRN

## 2014-02-01 MED ORDER — MIDAZOLAM HCL 5 MG/5ML IJ SOLN
INTRAMUSCULAR | Status: DC | PRN
  Administered 2014-02-01: 2 mg via INTRAVENOUS

## 2014-02-01 MED ORDER — INSULIN GLARGINE 100 UNITS/ML SOLOSTAR PEN
26.0000 [IU] | PEN_INJECTOR | Freq: Every day | SUBCUTANEOUS | Status: DC
  Filled 2014-02-01: qty 3

## 2014-02-01 MED ORDER — HYDROCODONE-ACETAMINOPHEN 5-325 MG PO TABS: 1.0000 | ORAL_TABLET | ORAL | Status: DC | PRN

## 2014-02-01 MED ORDER — INSULIN ASPART 100 UNIT/ML ~~LOC~~ SOLN
SUBCUTANEOUS | Status: AC
  Filled 2014-02-01: qty 1

## 2014-02-01 MED ORDER — INSULIN GLARGINE 100 UNIT/ML ~~LOC~~ SOLN
26.0000 [IU] | Freq: Every day | SUBCUTANEOUS | Status: DC
  Administered 2014-02-01 – 2014-02-04 (×4): 26 [IU] via SUBCUTANEOUS
  Filled 2014-02-01 (×5): qty 0.26

## 2014-02-01 MED ORDER — DOCUSATE SODIUM 100 MG PO CAPS
100.0000 mg | ORAL_CAPSULE | Freq: Two times a day (BID) | ORAL | Status: DC
  Administered 2014-02-01 – 2014-02-05 (×7): 100 mg via ORAL
  Filled 2014-02-01 (×7): qty 1

## 2014-02-01 MED ORDER — PHENYLEPHRINE 40 MCG/ML (10ML) SYRINGE FOR IV PUSH (FOR BLOOD PRESSURE SUPPORT)
PREFILLED_SYRINGE | INTRAVENOUS | Status: AC
  Filled 2014-02-01: qty 10

## 2014-02-01 MED ORDER — SODIUM CHLORIDE 0.9 % IV SOLN: 250.0000 mL | INTRAVENOUS | Status: DC

## 2014-02-01 MED ORDER — LIDOCAINE-EPINEPHRINE 1 %-1:100000 IJ SOLN
INTRAMUSCULAR | Status: DC | PRN
  Administered 2014-02-01: 10 mL

## 2014-02-01 MED ORDER — OXYCODONE HCL 5 MG/5ML PO SOLN: 5.0000 mg | Freq: Once | ORAL | Status: AC | PRN

## 2014-02-01 MED ORDER — LACTATED RINGERS IV SOLN
INTRAVENOUS | Status: DC | PRN
  Administered 2014-02-01 (×3): via INTRAVENOUS

## 2014-02-01 MED ORDER — DIAZEPAM 5 MG PO TABS
ORAL_TABLET | ORAL | Status: AC
Start: 2014-02-01 — End: 2014-02-02
  Filled 2014-02-01: qty 1

## 2014-02-01 MED ORDER — OXYCODONE HCL 5 MG PO TABS
ORAL_TABLET | ORAL | Status: AC
  Filled 2014-02-01: qty 1

## 2014-02-01 MED FILL — Sodium Chloride IV Soln 0.9%: INTRAVENOUS | Qty: 1000 | Status: AC

## 2014-02-01 MED FILL — Heparin Sodium (Porcine) Inj 1000 Unit/ML: INTRAMUSCULAR | Qty: 30 | Status: AC

## 2014-02-01 MED FILL — Sodium Chloride Irrigation Soln 0.9%: Qty: 3000 | Status: AC

## 2014-02-01 SURGICAL SUPPLY — 88 items
BAG DECANTER FOR FLEXI CONT (MISCELLANEOUS) IMPLANT
BENZOIN TINCTURE PRP APPL 2/3 (GAUZE/BANDAGES/DRESSINGS) ×3 IMPLANT
BLADE SURG ROTATE 9660 (MISCELLANEOUS) ×3 IMPLANT
BONE MATRIX OSTEOCEL PRO MED (Bone Implant) ×6 IMPLANT
BUR MATCHSTICK NEURO 3.0 LAGG (BURR) ×3 IMPLANT
BUR PRECISION FLUTE 5.0 (BURR) ×3 IMPLANT
CAGE COROENT LRG MP 11X9X28-8 (Cage) ×6 IMPLANT
CAGE SPINAL COROENT MP 11X28X9 (Cage) ×2 IMPLANT
CANISTER SUCT 3000ML (MISCELLANEOUS) ×3 IMPLANT
CLOSURE WOUND 1/2 X4 (GAUZE/BANDAGES/DRESSINGS) ×1
CONT SPEC 4OZ CLIKSEAL STRL BL (MISCELLANEOUS) ×6 IMPLANT
CONT SPEC STER OR (MISCELLANEOUS) ×3 IMPLANT
COROENT MP 11X28X9MM (Cage) ×4 IMPLANT
COVER BACK TABLE 24X17X13 BIG (DRAPES) IMPLANT
COVER TABLE BACK 60X90 (DRAPES) ×3 IMPLANT
DERMABOND ADHESIVE PROPEN (GAUZE/BANDAGES/DRESSINGS) ×4
DERMABOND ADVANCED (GAUZE/BANDAGES/DRESSINGS)
DERMABOND ADVANCED .7 DNX12 (GAUZE/BANDAGES/DRESSINGS) IMPLANT
DERMABOND ADVANCED .7 DNX6 (GAUZE/BANDAGES/DRESSINGS) ×2 IMPLANT
DRAPE C-ARM 42X72 X-RAY (DRAPES) ×6 IMPLANT
DRAPE LAPAROTOMY 100X72X124 (DRAPES) ×3 IMPLANT
DRAPE POUCH INSTRU U-SHP 10X18 (DRAPES) ×3 IMPLANT
DRAPE PROXIMA HALF (DRAPES) ×6 IMPLANT
DRAPE SURG 17X23 STRL (DRAPES) ×3 IMPLANT
DRESSING TELFA 8X3 (GAUZE/BANDAGES/DRESSINGS) IMPLANT
DRSG OPSITE POSTOP 4X8 (GAUZE/BANDAGES/DRESSINGS) ×3 IMPLANT
DURAPREP 26ML APPLICATOR (WOUND CARE) ×3 IMPLANT
ELECT BLADE 4.0 EZ CLEAN MEGAD (MISCELLANEOUS) ×3
ELECT REM PT RETURN 9FT ADLT (ELECTROSURGICAL) ×3
ELECTRODE BLDE 4.0 EZ CLN MEGD (MISCELLANEOUS) ×1 IMPLANT
ELECTRODE REM PT RTRN 9FT ADLT (ELECTROSURGICAL) ×1 IMPLANT
EVACUATOR 1/8 PVC DRAIN (DRAIN) ×3 IMPLANT
GAUZE SPONGE 4X4 16PLY XRAY LF (GAUZE/BANDAGES/DRESSINGS) ×3 IMPLANT
GLOVE BIO SURGEON STRL SZ8 (GLOVE) ×9 IMPLANT
GLOVE BIOGEL PI IND STRL 7.5 (GLOVE) ×1 IMPLANT
GLOVE BIOGEL PI IND STRL 8 (GLOVE) ×3 IMPLANT
GLOVE BIOGEL PI IND STRL 8.5 (GLOVE) ×2 IMPLANT
GLOVE BIOGEL PI INDICATOR 7.5 (GLOVE) ×2
GLOVE BIOGEL PI INDICATOR 8 (GLOVE) ×6
GLOVE BIOGEL PI INDICATOR 8.5 (GLOVE) ×4
GLOVE ECLIPSE 8.0 STRL XLNG CF (GLOVE) ×6 IMPLANT
GLOVE EXAM NITRILE LRG STRL (GLOVE) IMPLANT
GLOVE EXAM NITRILE MD LF STRL (GLOVE) ×3 IMPLANT
GLOVE EXAM NITRILE XL STR (GLOVE) IMPLANT
GLOVE EXAM NITRILE XS STR PU (GLOVE) IMPLANT
GLOVE SURG SS PI 7.0 STRL IVOR (GLOVE) ×9 IMPLANT
GOWN BRE IMP SLV AUR LG STRL (GOWN DISPOSABLE) IMPLANT
GOWN BRE IMP SLV AUR XL STRL (GOWN DISPOSABLE) IMPLANT
GOWN STRL REIN 2XL LVL4 (GOWN DISPOSABLE) IMPLANT
GOWN STRL REUS W/ TWL LRG LVL3 (GOWN DISPOSABLE) ×1 IMPLANT
GOWN STRL REUS W/ TWL XL LVL3 (GOWN DISPOSABLE) ×3 IMPLANT
GOWN STRL REUS W/TWL 2XL LVL3 (GOWN DISPOSABLE) ×6 IMPLANT
GOWN STRL REUS W/TWL LRG LVL3 (GOWN DISPOSABLE) ×2
GOWN STRL REUS W/TWL XL LVL3 (GOWN DISPOSABLE) ×6
KIT BASIN OR (CUSTOM PROCEDURE TRAY) ×3 IMPLANT
KIT POSITION SURG JACKSON T1 (MISCELLANEOUS) ×3 IMPLANT
KIT ROOM TURNOVER OR (KITS) ×3 IMPLANT
MILL MEDIUM DISP (BLADE) ×3 IMPLANT
NEEDLE HYPO 25X1 1.5 SAFETY (NEEDLE) ×3 IMPLANT
NEEDLE SPNL 18GX3.5 QUINCKE PK (NEEDLE) ×3 IMPLANT
NS IRRIG 1000ML POUR BTL (IV SOLUTION) ×3 IMPLANT
PACK LAMINECTOMY NEURO (CUSTOM PROCEDURE TRAY) ×3 IMPLANT
PAD ARMBOARD 7.5X6 YLW CONV (MISCELLANEOUS) ×9 IMPLANT
PATTIES SURGICAL .5 X.5 (GAUZE/BANDAGES/DRESSINGS) IMPLANT
PATTIES SURGICAL .5 X1 (DISPOSABLE) IMPLANT
PATTIES SURGICAL 1X1 (DISPOSABLE) IMPLANT
ROD PREBENT ARMADA 90MM SPINE (Rod) ×6 IMPLANT
SCREW ARMADA 5.5X50 (Screw) ×18 IMPLANT
SCREW LOCK (Screw) ×16 IMPLANT
SCREW LOCK 100X5.5X OPN (Screw) ×8 IMPLANT
SCREW POLY ARM15T 5.5X55MM (Screw) ×6 IMPLANT
SPONGE GAUZE 4X4 12PLY (GAUZE/BANDAGES/DRESSINGS) IMPLANT
SPONGE LAP 4X18 X RAY DECT (DISPOSABLE) ×6 IMPLANT
SPONGE SURGIFOAM ABS GEL 100 (HEMOSTASIS) ×3 IMPLANT
STAPLER SKIN PROX WIDE 3.9 (STAPLE) IMPLANT
STRIP CLOSURE SKIN 1/2X4 (GAUZE/BANDAGES/DRESSINGS) ×2 IMPLANT
SUT VIC AB 1 CT1 18XBRD ANBCTR (SUTURE) ×2 IMPLANT
SUT VIC AB 1 CT1 8-18 (SUTURE) ×4
SUT VIC AB 2-0 CT1 18 (SUTURE) ×9 IMPLANT
SUT VIC AB 3-0 SH 8-18 (SUTURE) ×6 IMPLANT
SYR 20ML ECCENTRIC (SYRINGE) ×3 IMPLANT
SYR 3ML LL SCALE MARK (SYRINGE) IMPLANT
SYR 5ML LL (SYRINGE) IMPLANT
TOWEL OR 17X24 6PK STRL BLUE (TOWEL DISPOSABLE) ×3 IMPLANT
TOWEL OR 17X26 10 PK STRL BLUE (TOWEL DISPOSABLE) ×3 IMPLANT
TRAP SPECIMEN MUCOUS 40CC (MISCELLANEOUS) ×3 IMPLANT
TRAY FOLEY CATH 14FRSI W/METER (CATHETERS) IMPLANT
WATER STERILE IRR 1000ML POUR (IV SOLUTION) ×3 IMPLANT

## 2014-02-01 NOTE — Interval H&P Note (Signed)
History and Physical Interval Note:  02/01/2014 7:20 AM  Jason Bernard  has presented today for surgery, with the diagnosis of spondylolisthesis lumbar degenerative disc disease lumbar stenosis lumbar radiculopathy  The various methods of treatment have been discussed with the patient and family. After consideration of risks, benefits and other options for treatment, the patient has consented to  Procedure(s) with comments: POSTERIOR LUMBAR FUSION 2 LEVEL (N/A) - L34 L5S1 posterior lumbar fusion with interbody prosthesis posterior lateral arthrodesis and posterior segmental instrumentation with exploration of fusion as a surgical intervention .  The patient's history has been reviewed, patient examined, no change in status, stable for surgery.  I have reviewed the patient's chart and labs.  Questions were answered to the patient's satisfaction.     [name] D

## 2014-02-01 NOTE — Transfer of Care (Signed)
Immediate Anesthesia Transfer of Care Note  Patient: Jason Bernard  Procedure(s) Performed: Procedure(s) with comments: Lumbar three-four, Lumbar five-Sacral one posterior lumbar fusion with interbody prosthesis posterior lateral arthrodesis and posterior segmental instrumentation with exploration of fusion (N/A) - Lumbar three-four, Lumbar five-Sacral one posterior lumbar fusion with interbody prosthesis posterior lateral arthrodesis and posterior segmental instrumentation with exploration of fusion  Patient Location: PACU  Anesthesia Type:General  Level of Consciousness: awake and alert   Airway & Oxygen Therapy: Patient Spontanous Breathing and Patient connected to nasal cannula oxygen  Post-op Assessment: Report given to PACU RN, Post -op Vital signs reviewed and stable and Patient moving all extremities X 4  Post vital signs: Reviewed and stable  Complications: No apparent anesthesia complications

## 2014-02-01 NOTE — Preoperative (Signed)
Beta Blockers   Reason not to administer Beta Blockers:Not Applicable 

## 2014-02-01 NOTE — Anesthesia Preprocedure Evaluation (Addendum)
Anesthesia Evaluation  Patient identified by MRN, date of birth, ID band Patient awake    Reviewed: Allergy & Precautions, H&P , NPO status , Patient's Chart, lab work & pertinent test results  Airway Mallampati: II TM Distance: >3 FB Neck ROM: Full    Dental  (+) Dental Advisory Given, Teeth Intact   Pulmonary          Cardiovascular hypertension, Pt. on medications     Neuro/Psych    GI/Hepatic   Endo/Other  diabetes, Type 2, Insulin Dependent  Renal/GU      Musculoskeletal  (+) Arthritis -,   Abdominal   Peds  Hematology   Anesthesia Other Findings   Reproductive/Obstetrics                         Anesthesia Physical Anesthesia Plan  ASA: II  Anesthesia Plan: General   Post-op Pain Management:    Induction: Intravenous  Airway Management Planned: Oral ETT  Additional Equipment:   Intra-op Plan:   Post-operative Plan: Extubation in OR  Informed Consent: I have reviewed the patients History and Physical, chart, labs and discussed the procedure including the risks, benefits and alternatives for the proposed anesthesia with the patient or authorized representative who has indicated his/her understanding and acceptance.   Dental advisory given  Plan Discussed with: CRNA and Surgeon  Anesthesia Plan Comments:        Anesthesia Quick Evaluation

## 2014-02-01 NOTE — Anesthesia Procedure Notes (Signed)
Procedure Name: Intubation Date/Time: 02/01/2014 7:33 AM Performed by: Elberta LeatherwoodURNER, Bristal Steffy E Pre-anesthesia Checklist: Patient identified, Emergency Drugs available, Suction available and Patient being monitored Patient Re-evaluated:Patient Re-evaluated prior to inductionOxygen Delivery Method: Circle system utilized Preoxygenation: Pre-oxygenation with 100% oxygen Intubation Type: IV induction Ventilation: Mask ventilation without difficulty and Oral airway inserted - appropriate to patient size Laryngoscope Size: Hyacinth MeekerMiller and 2 Grade View: Grade I Tube type: Oral Tube size: 7.5 mm Number of attempts: 1 Airway Equipment and Method: Stylet and Oral airway Placement Confirmation: ETT inserted through vocal cords under direct vision,  positive ETCO2 and breath sounds checked- equal and bilateral Secured at: 23 cm Tube secured with: Tape Dental Injury: Teeth and Oropharynx as per pre-operative assessment
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
## 2014-02-01 NOTE — Anesthesia Postprocedure Evaluation (Signed)
Anesthesia Post Note  Patient: Jason Bernard  Procedure(s) Performed: Procedure(s) (LRB): Lumbar three-four, Lumbar five-Sacral one posterior lumbar fusion with interbody prosthesis posterior lateral arthrodesis and posterior segmental instrumentation with exploration of fusion (N/A)  Anesthesia type: general  Patient location: PACU  Post pain: Pain level controlled  Post assessment: Patient's Cardiovascular Status Stable  Last Vitals:  Filed Vitals:   02/01/14 1500  BP: 135/76  Pulse: 74  Temp: 36.6 C  Resp: 18    Post vital signs: Reviewed and stable  Level of consciousness: sedated  Complications: No apparent anesthesia complications

## 2014-02-01 NOTE — Progress Notes (Signed)
Patient arrived from PACU to 4N15. [place]. VSS, drowsy, reports minimal pain. Family at bedside. Will continue to monitor.
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
## 2014-02-02 LAB — GLUCOSE, CAPILLARY
GLUCOSE-CAPILLARY: 164 mg/dL — AB (ref 70–99)
GLUCOSE-CAPILLARY: 126 mg/dL — AB (ref 70–99)
Glucose-Capillary: 143 mg/dL — ABNORMAL HIGH (ref 70–99)
Glucose-Capillary: 120 mg/dL — ABNORMAL HIGH (ref 70–99)

## 2014-02-02 MED ORDER — HYDROMORPHONE HCL PF 1 MG/ML IJ SOLN
0.5000 mg | INTRAMUSCULAR | Status: DC | PRN
  Administered 2014-02-02 – 2014-02-03 (×5): 1 mg via INTRAVENOUS
  Filled 2014-02-02 (×5): qty 1

## 2014-02-02 MED ORDER — HYDROMORPHONE HCL 2 MG PO TABS
2.0000 mg | ORAL_TABLET | ORAL | Status: DC | PRN
  Administered 2014-02-03: 4 mg via ORAL
  Administered 2014-02-03 (×2): 2 mg via ORAL
  Administered 2014-02-03: 4 mg via ORAL
  Administered 2014-02-03: 2 mg via ORAL
  Administered 2014-02-04 – 2014-02-05 (×13): 4 mg via ORAL
  Filled 2014-02-02 (×2): qty 2
  Filled 2014-02-02: qty 1
  Filled 2014-02-02 (×4): qty 2
  Filled 2014-02-02: qty 1
  Filled 2014-02-02 (×3): qty 2
  Filled 2014-02-02: qty 1
  Filled 2014-02-02 (×4): qty 2
  Filled 2014-02-02: qty 1
  Filled 2014-02-02: qty 2
  Filled 2014-02-02: qty 1

## 2014-02-02 NOTE — Progress Notes (Signed)
Postop day 1. Patient with significant postoperative incisional pain. No radicular pain. Patient also with postoperative nausea vomiting. Overall feels miserable. Denies abdominal pain. No other complaints.  Afebrile. Very mild hypertension. Urine output high. Drain output moderate. Awake and alert. Obviously uncomfortable. Motor and sensory function intact. Dressing clean and dry. Chest and abdomen benign.  Overall stable postop. Continue efforts At mobilization. Continue to work at pain control.

## 2014-02-02 NOTE — Evaluation (Signed)
Physical Therapy Evaluation Patient Details Name: GRISELDA TOSH MRN: [id number] DOB: September 09, 1951 Today's Date: 02/02/2014 Time: 1191-4782 PT Time Calculation (min): 29 min  PT Assessment / Plan / Recommendation History of Present Illness  63 y.o. s/p Lumbar three-four, Lumbar five-Sacral one posterior lumbar fusion with interbody prosthesis posterior lateral arthrodesis and posterior segmental instrumentation with exploration of fusion (N/A)  Clinical Impression  Pt with flat affect and barely opened eyes during session.  Pt ed on donning/doffing brace and pt not even looking at brace repeatedly trying to put brace together with velcro against gown and not brace.  Needed firm cueing to attend to task.  Pt remains in crouched position during gait with Bil LEs flexed and leans heavily on RW despite cueing.  Will need continued PT for improved mobility and safety.      PT Assessment  Patient needs continued PT services    Follow Up Recommendations  Home health PT;Supervision/Assistance - 24 hour    Does the patient have the potential to tolerate intense rehabilitation      Barriers to Discharge        Equipment Recommendations  None recommended by PT    Recommendations for Other Services     Frequency Min 5X/week    Precautions / Restrictions Precautions Precautions: Back;Fall Precaution Booklet Issued: Yes (comment) Required Braces or Orthoses: Spinal Brace Spinal Brace: Lumbar corset;Applied in sitting position Restrictions Weight Bearing Restrictions: No   Pertinent Vitals/Pain 6/10 in low back during mobility.  Premedicated.        Mobility  Bed Mobility Overal bed mobility: Needs Assistance Bed Mobility: Rolling;Sidelying to Sit Rolling: Min guard Sidelying to sit: Min assist General bed mobility comments: Cues for technique. Transfers Overall transfer level: Needs assistance Equipment used: Rolling walker (2 wheeled) Transfers: Sit to/from Stand Sit to Stand:  Mod assist General transfer comment: cues for UE use and A for coming to stand.  MinA for return to sitting.   Ambulation/Gait Ambulation/Gait assistance: Min assist Ambulation Distance (Feet): 100 Feet Assistive device: Rolling walker (2 wheeled) Gait Pattern/deviations: Step-through pattern;Decreased stride length;Shuffle;Trunk flexed (Crouched with Bil knee flexion) Gait velocity interpretation: Below normal speed for age/gender General Gait Details: pt remains in crouched position with Bil knees flexed during gait and seems not to have any awareness of this.  cueing for upright posture and LE extension in stance.  pt leans heavily on RW and has Bil eyes barely open.      Exercises     PT Diagnosis: Difficulty walking  PT Problem List: Decreased strength;Decreased activity tolerance;Decreased balance;Decreased mobility;Decreased knowledge of use of DME;Decreased knowledge of precautions PT Treatment Interventions: DME instruction;Gait training;Stair training;Functional mobility training;Therapeutic activities;Therapeutic exercise;Balance training;Neuromuscular re-education;Patient/family education     PT Goals(Current goals can be found in the care plan section) Acute Rehab PT Goals Patient Stated Goal: not stated PT Goal Formulation: With patient Time For Goal Achievement: 02/09/14 Potential to Achieve Goals: Good  Visit Information  Last PT Received On: 02/02/14 Assistance Needed: +1 History of Present Illness: 63 y.o. s/p Lumbar three-four, Lumbar five-Sacral one posterior lumbar fusion with interbody prosthesis posterior lateral arthrodesis and posterior segmental instrumentation with exploration of fusion (N/A)       Prior Functioning  Home Living Family/patient expects to be discharged to:: Private residence Living Arrangements: Spouse/significant other Available Help at Discharge: Family;Available 24 hours/day (2 weeks 24/7) Type of Home: House Home Access: Stairs to  enter [employer] of Steps: 5 Entrance Stairs-Rails: None Home Layout: Two level;Bed/bath  upstairs Alternate Level Stairs-Number of Steps: flight Alternate Level Stairs-Rails: Left Home Equipment: Walker - 2 wheels;Shower seat - built in;Crutches;Adaptive equipment Adaptive Equipment: Long-handled sponge;Long-handled shoe horn;Reacher Prior Function Level of Independence: Independent with assistive device(s) Comments: using walker last two weeks Communication Communication: No difficulties Dominant Hand: Left    Cognition  Cognition Arousal/Alertness: Awake/alert Behavior During Therapy: WFL for tasks assessed/performed Overall Cognitive Status: Within Functional Limits for tasks assessed    Extremity/Trunk Assessment Upper Extremity Assessment Upper Extremity Assessment: Defer to OT evaluation Lower Extremity Assessment Lower Extremity Assessment: Generalized weakness Cervical / Trunk Assessment Cervical / Trunk Assessment: Kyphotic   Balance Balance Overall balance assessment: Needs assistance Standing balance support: Bilateral upper extremity supported Standing balance-Leahy Scale: Poor  End of Session PT - End of Session Equipment Utilized During Treatment: Gait belt;Back brace Activity Tolerance: Patient tolerated treatment well Patient left: in chair;with call bell/phone within reach Nurse Communication: Mobility status  GP     Sunny SchleinRitenour, Devanta Daniel F, [place] CarolinaPT [phone] 02/02/2014, 3:19 PM

## 2014-02-02 NOTE — Evaluation (Signed)
Occupational Therapy Evaluation Patient Details Name: Jason Bernard MRN: [id number] DOB: Jul 15, 1951 Today's Date: 02/02/2014 Time: 4098-1191 OT Time Calculation (min): 21 min  OT Assessment / Plan / Recommendation History of present illness 63 y.o. s/p Lumbar three-four, Lumbar five-Sacral one posterior lumbar fusion with interbody prosthesis posterior lateral arthrodesis and posterior segmental instrumentation with exploration of fusion (N/A)   Clinical Impression   Pt presents with below problem list. Pt independent with ADLs, PTA. Pt will benefit from acute OT to increase independence prior to d/c.     OT Assessment  Patient needs continued OT Services    Follow Up Recommendations  No OT follow up;Supervision - Intermittent (when OOB/mobility)    Barriers to Discharge      Equipment Recommendations  3 in 1 bedside comode    Recommendations for Other Services    Frequency  Min 2X/week    Precautions / Restrictions Precautions Precautions: Back;Fall Precaution Booklet Issued: No Precaution Comments: Reviewed precautions with pt and wife present Restrictions Weight Bearing Restrictions: No   Pertinent Vitals/Pain Pain 7/10. Pt positioned with pillows behind back.    ADL  Upper Body Dressing: Minimal assistance (back brace) Where Assessed - Upper Body Dressing: Unsupported sitting Lower Body Dressing: Moderate assistance Where Assessed - Lower Body Dressing: Supported sit to Pharmacist, hospital: Moderate assistance Toilet Transfer Method: Sit to stand Toilet Transfer Equipment: Other (comment) (from bed) Tub/Shower Transfer Method: Not assessed Equipment Used: Gait belt;Back brace;Reacher;Long-handled sponge;Long-handled shoe horn;Rolling walker;Sock aid;Other (comment) (toilet aid) Transfers/Ambulation Related to ADLs: Min A for stand pivot. Mod A for sit to stand and Min A for stand to sit transfer. ADL Comments: Educated on AE for LB ADLs. Pt practiced with  reacher and sockaid. Discussed use of two cups for teeth care as well as having grooming items on left side of sink.     OT Diagnosis: Acute pain  OT Problem List: Decreased strength;Decreased activity tolerance;Impaired balance (sitting and/or standing);Decreased knowledge of use of DME or AE;Decreased knowledge of precautions;Pain OT Treatment Interventions: Self-care/ADL training;DME and/or AE instruction;Therapeutic activities;Patient/family education;Balance training   OT Goals(Current goals can be found in the care plan section) Acute Rehab OT Goals Patient Stated Goal: not stated OT Goal Formulation: With patient Time For Goal Achievement: 02/09/14 Potential to Achieve Goals: Good ADL Goals Pt Will Perform Grooming: with modified independence;standing Pt Will Perform Lower Body Bathing: with modified independence;with adaptive equipment;sit to/from stand Pt Will Perform Lower Body Dressing: with modified independence;with adaptive equipment;sit to/from stand Pt Will Transfer to Toilet: with modified independence;ambulating (3 in 1 over commode) Pt Will Perform Toileting - Clothing Manipulation and hygiene: with modified independence;sit to/from stand Pt Will Perform Tub/Shower Transfer: Shower transfer;with supervision;ambulating;rolling walker;shower seat Additional ADL Goal #1: Pt will independently verbalize and demonstrate 3/3 back precautions.  Visit Information  Last OT Received On: 02/02/14 Assistance Needed: +1 History of Present Illness: 63 y.o. s/p Lumbar three-four, Lumbar five-Sacral one posterior lumbar fusion with interbody prosthesis posterior lateral arthrodesis and posterior segmental instrumentation with exploration of fusion (N/A)       Prior Functioning     Home Living Family/patient expects to be discharged to:: Private residence Living Arrangements: Spouse/significant other Available Help at Discharge: Family;Available 24 hours/day (2 weeks 24/7) Type  of Home: House Home Access: Stairs to enter [employer] of Steps: 5 Entrance Stairs-Rails: None Home Layout: Two level;Bed/bath upstairs Alternate Level Stairs-Number of Steps: flight Alternate Level Stairs-Rails: Left Home Equipment: Walker - 2 wheels;Shower seat - built in;Crutches;Adaptive  equipment Adaptive Equipment: Long-handled sponge;Long-handled shoe horn;Reacher Prior Function Level of Independence: Independent with assistive device(s) Comments: using walker last two weeks Communication Communication: No difficulties Dominant Hand: Left         Vision/Perception Vision - History Baseline Vision: Wears glasses only for reading   Cognition  Cognition Arousal/Alertness: Awake/alert Behavior During Therapy: WFL for tasks assessed/performed Overall Cognitive Status: Within Functional Limits for tasks assessed    Extremity/Trunk Assessment Upper Extremity Assessment Upper Extremity Assessment: Overall WFL for tasks assessed Lower Extremity Assessment Lower Extremity Assessment: Defer to PT evaluation     Mobility Bed Mobility Overal bed mobility: Needs Assistance Bed Mobility: Rolling;Sidelying to Sit Rolling: Min guard Sidelying to sit: Min guard General bed mobility comments: Cues for technique. Transfers Overall transfer level: Needs assistance Equipment used: Rolling walker (2 wheeled) Transfers: Sit to/from [employer] CorporationStand;Stand Pivot Transfers Sit to Stand: Mod assist;Min assist General transfer comment: Mod A for sit to stand and Min A for stand pivot and stand to sit transfer.     Exercise     Balance     End of Session OT - End of Session Equipment Utilized During Treatment: Gait belt;Rolling walker;Back brace Activity Tolerance: Other (comment) (nausea) Patient left: in chair;with call bell/phone within reach;with family/visitor present  GO     Earlie RavelingStraub, Kierra Jezewski L OTR/L [phone] 02/02/2014, 11:20 AM

## 2014-02-03 LAB — GLUCOSE, CAPILLARY
GLUCOSE-CAPILLARY: 125 mg/dL — AB (ref 70–99)
GLUCOSE-CAPILLARY: 169 mg/dL — AB (ref 70–99)
Glucose-Capillary: 124 mg/dL — ABNORMAL HIGH (ref 70–99)
Glucose-Capillary: 120 mg/dL — ABNORMAL HIGH (ref 70–99)

## 2014-02-03 MED ORDER — PANTOPRAZOLE SODIUM 40 MG PO TBEC
40.0000 mg | DELAYED_RELEASE_TABLET | Freq: Every day | ORAL | Status: DC
  Administered 2014-02-03 – 2014-02-04 (×2): 40 mg via ORAL
  Filled 2014-02-03 (×2): qty 1

## 2014-02-03 NOTE — Progress Notes (Signed)
   CARE MANAGEMENT NOTE 02/03/2014  Patient:  Lafayette DragonHARTMAN,Muhammed W   Account Number:  [id number]  Date Initiated:  02/03/2014  Documentation initiated by:  [facility] SurgeryJEFFRIES,Everado Pillsbury  Subjective/Objective Assessment:   adm: back pain ; spondylolisthesis lumbar degenerative disc disease lumbar stenosis lumbar radiculopathy     Action/Plan:   discharge planning   Anticipated DC Date:  02/04/2014   Anticipated DC Plan:  HOME W HOME HEALTH SERVICES      DC Planning Services  CM consult      [facility]   Choice offered to / List presented to:  C-1 Patient      DME agency  [facility]     HH arranged  HH[facility]2 PT      [facility]   Status of service:  Completed, signed off Medicare Important Message given?   (If response is "NO", the following Medicare IM given date fields will be blank) Date Medicare IM given:   Date Additional Medicare IM given:    Discharge Disposition:  HOME W HOME HEALTH SERVICES  Per UR Regulation:    If discussed at Long Length of Stay Meetings, dates discussed:    Comments:  02/03/14 09:30 CM spoke with pt and wife, Deloris [phone] to offer choice.  [facility] is chosen to render HHPT services.  3n1 to be delivered to room prior to discharge. Address and contact numbers verified with pt and Deloris. [facility].  No other CM needs were communicated.  Freddy JakschSarah Anette Barra, BSN, CM [phone].

## 2014-02-03 NOTE — Progress Notes (Signed)
Occupational Therapy Treatment Patient Details Name: Lafayette DragonRaleigh W Rightmyer MRN: [id number] DOB: 02/22/1951 Today's Date: 02/03/2014 Time: 7829-56211446-1514 OT Time Calculation (min): 28 min  OT Assessment / Plan / Recommendation  History of present illness 63 y.o. s/p Lumbar three-four, Lumbar five-Sacral one posterior lumbar fusion with interbody prosthesis posterior lateral arthrodesis and posterior segmental instrumentation with exploration of fusion (N/A)   OT comments  Pt moving well during session. Practiced with AE for LB ADLs and simulated shower transfer.   Follow Up Recommendations  No OT follow up;Supervision - Intermittent (when OOB/mobility)    Barriers to Discharge       Equipment Recommendations  3 in 1 bedside comode    Recommendations for Other Services    Frequency Min 2X/week   Progress towards OT Goals Progress towards OT goals: Progressing toward goals  Plan Discharge plan remains appropriate    Precautions / Restrictions Precautions Precautions: Back;Fall Precaution Comments: Pt able to state 3/3 precautions. Required Braces or Orthoses: Spinal Brace Spinal Brace: Lumbar corset;Applied in sitting position Restrictions Weight Bearing Restrictions: No   Pertinent Vitals/Pain Pain 7/10. Repositioned.     ADL  Grooming: Teeth care;Set up;Supervision/safety Where Assessed - Grooming: Supported standing Upper Body Dressing: Set up;Supervision/safety (back brace) Where Assessed - Upper Body Dressing: Unsupported sitting Toilet Transfer: Minimal assistance Toilet Transfer Method: Sit to stand Toilet Transfer Equipment:  (chair and bed) Tub/Shower Transfer: Min guard Tub/Shower Transfer Method: Science writerAmbulating Tub/Shower Transfer Equipment: Walk in shower;Shower seat with back Equipment Used: Back brace;Gait belt;Rolling walker;Sock aid;Reacher;Long-handled sponge Transfers/Ambulation Related to ADLs: Min guard for ambulation-cues for upright posture; Min guard/Min A for  transfers. ADL Comments: Practiced with AE for LB ADLs. Pt performed grooming at sink with several cues for precautions.  Discussed use of bag on walker and talked about having wife pick up rugs in house.     OT Diagnosis:    OT Problem List:   OT Treatment Interventions:     OT Goals(current goals can now be found in the care plan section) Acute Rehab OT Goals Patient Stated Goal: not stated OT Goal Formulation: With patient Time For Goal Achievement: 02/09/14 Potential to Achieve Goals: Good  Visit Information  Last OT Received On: 02/03/14 Assistance Needed: +1 History of Present Illness: 63 y.o. s/p Lumbar three-four, Lumbar five-Sacral one posterior lumbar fusion with interbody prosthesis posterior lateral arthrodesis and posterior segmental instrumentation with exploration of fusion (N/A)    Subjective Data      Prior Functioning       Cognition  Cognition Arousal/Alertness: Awake/alert Behavior During Therapy: WFL for tasks assessed/performed Overall Cognitive Status: Within Functional Limits for tasks assessed    Mobility  Bed Mobility Overal bed mobility: Needs Assistance Bed Mobility: Rolling;Sidelying to Sit;Sit to Sidelying Rolling: Supervision Sidelying to sit: Modified independent (Device/Increase time) Sit to sidelying: Supervision Transfers Overall transfer level: Needs assistance Equipment used: Rolling walker (2 wheeled) Transfers: Sit to/from Stand Sit to Stand: Min guard General transfer comment: cues for hand placement.    Exercises      Balance    End of Session OT - End of Session Equipment Utilized During Treatment: Gait belt;Rolling walker;Back brace Activity Tolerance: Patient tolerated treatment well Patient left: in bed;with call bell/phone within reach  GO     Earlie RavelingStraub, Mickey Esguerra L OTR/L [phone] 02/03/2014, 4:42 PM

## 2014-02-03 NOTE — Progress Notes (Signed)
Physical Therapy Treatment Patient Details Name: Jason Bernard MRN: [id number] DOB: 11/19/1951 Today's Date: 02/03/2014    History of Present Illness 63 y.o. s/p Lumbar three-four, Lumbar five-Sacral one posterior lumbar fusion with interbody prosthesis posterior lateral arthrodesis and posterior segmental instrumentation with exploration of fusion (N/A)    PT Comments    Patient appears more alert and awake today.  Did will with mobility and still limited by pain.  Encourage patient to ambulate several times/day with nursing staff.  Will continue to follow.  Follow Up Recommendations  Home health PT;Supervision - Intermittent     Equipment Recommendations  None recommended by PT    Recommendations for Other Services    Precautions / Restrictions Precautions Spinal Brace: Lumbar corset;Applied in sitting position        Pertinent Vitals/Pain        Patient with 8/10 pain, was pre-medicated prior to therapy  Mobility  Bed Mobility Overal bed mobility: Modified Independent                Transfers Overall transfer level: Needs assistance Equipment used: Rolling walker (2 wheeled) Transfers: Sit to/from Stand Sit to Stand: Min guard         General transfer comment: cues for UE use and A for coming to stand  Ambulation/Gait Ambulation/Gait assistance: Min guard Ambulation Distance (Feet): 100 Feet Assistive device: Rolling walker (2 wheeled) Gait Pattern/deviations: Step-through pattern;Trunk flexed   Gait velocity interpretation: Below normal speed for age/gender General Gait Details: patient still with forward flexed position and with knees flexed, although, does seem to be as severe as yesterday.  Less leaning on RW today.                 Balance           Standing balance support: No upper extremity supported Standing balance-Leahy Scale: Good                      Exercises        Cognition Arousal/Alertness:  Awake/alert Behavior During Therapy: WFL for tasks assessed/performed Overall Cognitive Status: Within Functional Limits for tasks assessed                      General Comments     PT Goals (current goals can now be found in the care plan section)    Frequency  Min 5X/week    PT Plan Current plan remains appropriate    End of Session Equipment Utilized During Treatment: Back brace Activity Tolerance: Patient tolerated treatment well Patient left: in chair;with call bell/phone within reach;with family/visitor present     Time: 1045-1100 PT Time Calculation (min): 15 min  Charges:  $Gait Training: 8-22 mins                    G Codes:      Olivia CanterMoton, Tiffay Pinette M 02/03/2014, 11:06 AM

## 2014-02-03 NOTE — Progress Notes (Signed)
Filed Vitals:   02/02/14 1806 02/02/14 2151 02/03/14 0141 02/03/14 0556  BP: 106/62 139/75 108/63 118/62  Pulse: 66 109 100 102  Temp: 98.8 F (37.1 C) 98.4 F (36.9 C) 99.4 F (37.4 C) 98.6 F (37 C)  TempSrc: Oral Oral Oral Oral  Resp: 20 20 20 20   Height:      Weight:      SpO2: 95% 95% 94% 95%    Patient with moderate discomfort, has been receiving IV morphine for pain. Has been having some nausea. Mobility has been limited. He's been out of bed and ambulated in the halls once yesterday, not yet today. Seen by PT and OT, PT stopped by earlier today, but the patient asked to hold off for a period time since he had just received pain medication.  Dressing removed, wound left open to air. Hemovac drain removed, sterile dressing applied the drain site.  Plan: Transition to oral analgesics. Stressed the importance of ambulation in the halls the patient and his wife.  Hewitt ShortsNUDELMAN,ROBERT W, MD 02/03/2014, 9:29 AM

## 2014-02-04 LAB — GLUCOSE, CAPILLARY
GLUCOSE-CAPILLARY: 114 mg/dL — AB (ref 70–99)
GLUCOSE-CAPILLARY: 200 mg/dL — AB (ref 70–99)
Glucose-Capillary: 146 mg/dL — ABNORMAL HIGH (ref 70–99)
Glucose-Capillary: 123 mg/dL — ABNORMAL HIGH (ref 70–99)

## 2014-02-04 NOTE — Progress Notes (Signed)
Pt. With noted bright red bleeding from area where hemovac was at; soaked thru gauze placed yesterday. Changed out gauze. Notified Arlys JohnBrian, RN for Dr. Venetia MaxonStern. Will monitor.

## 2014-02-04 NOTE — Progress Notes (Signed)
Subjective: Patient reports pain improving  Objective: Vital signs in last 24 hours: Temp:  [98 F (36.7 C)-100.4 F (38 C)] 98.7 F (37.1 C) (03/02 0841) Pulse Rate:  [90-108] 102 (03/02 0841) Resp:  [16-20] 18 (03/02 0841) BP: (91-134)/(43-71) 134/66 mmHg (03/02 0841) SpO2:  [95 %-98 %] 96 % (03/02 0841)  Intake/Output from previous day: 03/01 0701 - 03/02 0700 In: -  Out: 825 [Urine:825] Intake/Output this shift: Total I/O In: 240 [P.O.:240] Out: -   Physical Exam: Full strength. Dressing CDI.  Lab Results: No results found for this basename: WBC, HGB, HCT, PLT,  in the last 72 hours BMET No results found for this basename: NA, K, CL, CO2, GLUCOSE, BUN, CREATININE, CALCIUM,  in the last 72 hours  Studies/Results: No results found.  Assessment/Plan: Mobilize with PT.  Dilaudid helping for analgesia.  To work on bowels.    LOS: 3 days    Dorian HeckleSTERN,Gurdeep Keesey D, MD 02/04/2014, 8:48 AM

## 2014-02-04 NOTE — Progress Notes (Signed)
Physical Therapy Treatment Patient Details Name: Jason Bernard MRN: [id number] DOB: 01/22/1951 Today's Date: 02/04/2014 Time: [id number] PT Time Calculation (min): 19 min  PT Assessment / Plan / Recommendation  History of Present Illness 63 y.o. s/p Lumbar three-four, Lumbar five-Sacral one posterior lumbar fusion with interbody prosthesis posterior lateral arthrodesis and posterior segmental instrumentation with exploration of fusion (N/A)   PT Comments   Pt with improved mobility and safety today.  Feel pt is ready for D/C from PT stand point.    Follow Up Recommendations  Home health PT;Supervision - Intermittent     Does the patient have the potential to tolerate intense rehabilitation     Barriers to Discharge        Equipment Recommendations  None recommended by PT    Recommendations for Other Services    Frequency Min 5X/week   Progress towards PT Goals Progress towards PT goals: Progressing toward goals  Plan Current plan remains appropriate    Precautions / Restrictions Precautions Precautions: Back;Fall Precaution Comments: Pt able to state 3/3 precautions. Required Braces or Orthoses: Spinal Brace Spinal Brace: Lumbar corset;Applied in sitting position Restrictions Weight Bearing Restrictions: No   Pertinent Vitals/Pain Indicates soreness from incision.      Mobility  Bed Mobility Overal bed mobility: Modified Independent General bed mobility comments: Demo'd good technique and Mod I today.   Transfers Overall transfer level: Needs assistance Equipment used: Rolling walker (2 wheeled) Transfers: Sit to/from Stand Sit to Stand: Min guard General transfer comment: cues for hand placement. Ambulation/Gait Ambulation/Gait assistance: Min guard Ambulation Distance (Feet): 250 Feet Assistive device: Rolling walker (2 wheeled) Gait Pattern/deviations: Step-through pattern;Decreased stride length;Trunk flexed Gait velocity interpretation: Below normal speed  for age/gender General Gait Details: pt continues to rely on RW with forward lean.  cues for upright posture.   Stairs: Yes Stairs assistance: Min guard Stair Management: One rail Right;Sideways;Forwards;Step to pattern Number of Stairs: 11 General stair comments: pt able to go forwards up stairs, but needs 2 hand on rail and turned sideways to go down stairs.      Exercises     PT Diagnosis:    PT Problem List:   PT Treatment Interventions:     PT Goals (current goals can now be found in the care plan section) Acute Rehab PT Goals Patient Stated Goal: not stated Time For Goal Achievement: 02/09/14 Potential to Achieve Goals: Good  Visit Information  Last PT Received On: 02/04/14 Assistance Needed: +1 History of Present Illness: 63 y.o. s/p Lumbar three-four, Lumbar five-Sacral one posterior lumbar fusion with interbody prosthesis posterior lateral arthrodesis and posterior segmental instrumentation with exploration of fusion (N/A)    Subjective Data  Patient Stated Goal: not stated   Cognition  Cognition Arousal/Alertness: Awake/alert Behavior During Therapy: WFL for tasks assessed/performed Overall Cognitive Status: Within Functional Limits for tasks assessed    Balance     End of Session PT - End of Session Equipment Utilized During Treatment: Back brace;Gait belt Activity Tolerance: Patient tolerated treatment well Patient left: in chair;with call bell/phone within reach Nurse Communication: Mobility status   GP     RitenourAlison Murray, Rees Matura F, South CarolinaPT [phone] 02/04/2014, 9:12 AM

## 2014-02-04 NOTE — Progress Notes (Signed)
UR complete.  Milika Ventress RN, MSN 

## 2014-02-05 LAB — GLUCOSE, CAPILLARY
GLUCOSE-CAPILLARY: 154 mg/dL — AB (ref 70–99)
GLUCOSE-CAPILLARY: 127 mg/dL — AB (ref 70–99)
Glucose-Capillary: 103 mg/dL — ABNORMAL HIGH (ref 70–99)

## 2014-02-05 MED ORDER — FLEET ENEMA 7-19 GM/118ML RE ENEM: 1.0000 | ENEMA | Freq: Every day | RECTAL | Status: DC | PRN

## 2014-02-05 NOTE — Discharge Summary (Signed)
Physician Discharge Summary  Patient ID: Jason Bernard MRN: [id number] DOB/AGE: [birth year] 63 y.o.  Admit date: 02/01/2014 Discharge date: 02/05/2014  Admission Diagnoses: spondylolisthesis lumbar degenerative disc disease lumbar stenosis lumbar radiculopathy L 34 and L 5 S 1    Discharge Diagnoses: spondylolisthesis lumbar degenerative disc disease lumbar stenosis lumbar radiculopathy L 34 and L 5 S 1 s/p Lumbar three-four, Lumbar five-Sacral one posterior lumbar fusion with interbody prosthesis posterior lateral arthrodesis and posterior segmental instrumentation with exploration of fusion (N/A) - Lumbar three-four, Lumbar five-Sacral one posterior lumbar fusion with interbody prosthesis posterior lateral arthrodesis and posterior segmental instrumentation with exploration of fusion at L 4 5 and posterolateral arthrodesis L 3 - S 1 levels. Decompression greater than would be typically performed with standard PLIF procedure as neural elements encased in scar were widely decompressed atboth the L 34 and L 5 S1 levels.   Active Problems:   Lumbar spondylosis with myelopathy   Discharged Condition: good  Hospital Course: Jason Bernard was admitted for surgery 02/01/14 with dx spondylolisthesis and stenosis.  Following uncomplicated redo decompression and L3-S1 PLIF, he recovered nicely in [facility] and transferred to [facility] for nursing care & therapies.  He has progressed steadily.  Consults: None  Significant Diagnostic Studies: radiology: X-Ray: intra-operative  Treatments: surgery: Lumbar three-four, Lumbar five-Sacral one posterior lumbar fusion with interbody prosthesis posterior lateral arthrodesis and posterior segmental instrumentation with exploration of fusion (N/A) - Lumbar three-four, Lumbar five-Sacral one posterior lumbar fusion with interbody prosthesis posterior lateral arthrodesis and posterior segmental instrumentation with exploration of fusion at L 4 5 and posterolateral  arthrodesis L 3 - S 1 levels. Decompression greater than would be typically performed with standard PLIF procedure as neural elements encased in scar were widely decompressed atboth the L 34 and L 5 S1 levels.    Discharge Exam: Blood pressure 101/51, pulse 89, temperature 98.5 F (36.9 C), temperature source Oral, resp. rate 20, height 5\' 11"  (1.803 m), weight 90.719 kg (200 lb), SpO2 96.00%. Alert, conversant. Good strength BLE. Incision with Dermabond. No erythema, swelling, or drainage. Drain site drsg intact & dry. No BM yet but passing gas. No abdominal distention. Lumbar pain controlled by po meds. Denies leg pain. Some leg numbness persists, as expected - reassured.    Disposition: Discharge to home.  Rx's to chart : Dilaudid 2mg  1-2 po q4hrs prn pain #60, Valium 5mg  1-2 po tid prn spasm #50. Pt verbalizes understanding of d/c instructions and agrees to call office to schedule 3-4 wk f/u with DrStern.       Medication List    ASK your doctor about these medications       atorvastatin 40 MG tablet  Commonly known as:  LIPITOR  Take 40 mg by mouth at bedtime.     baclofen 10 MG tablet  Commonly known as:  LIORESAL  Take 10-20 mg by mouth 3 (three) times daily. Take 10mg  in the morning, 10 mg at 4pm, and 20mg  at bedtime.     insulin glargine 100 units/mL Soln  Commonly known as:  LANTUS  Inject 26 Units into the skin at bedtime.     lisinopril 5 MG tablet  Commonly known as:  PRINIVIL,ZESTRIL  Take 5 mg by mouth at bedtime.     traMADol 50 MG tablet  Commonly known as:  ULTRAM  Take 100 mg by mouth every 6 (six) hours as needed for moderate pain.     traZODone 50 MG tablet  Commonly known as:  DESYREL  Take 50 mg by mouth at bedtime.           Follow-up Information   Follow up with [facility]. (home health physical therapy)    Contact information:   [address] [place] [id number] [phone]       Signed: Georgiann Cockeroteat,  Izabel Chim 02/05/2014, 7:43 AM

## 2014-02-05 NOTE — Progress Notes (Signed)
Subjective: Patient reports "I don't have any leg pain...just a little in my back"  Objective: Vital signs in last 24 hours: Temp:  [98.5 F (36.9 C)-99.6 F (37.6 C)] 98.5 F (36.9 C) (03/03 16100608) Pulse Rate:  [86-102] 89 (03/03 0608) Resp:  [18-20] 20 (03/03 0608) BP: (101-144)/(51-94) 101/51 mmHg (03/03 0608) SpO2:  [92 %-97 %] 96 % (03/03 0608)  Intake/Output from previous day: 03/02 0701 - 03/03 0700 In: 480 [P.O.:480] Out: 1700 [Urine:1700] Intake/Output this shift:    Alert, conversant. Good strength BLE. Incision with Dermabond. No erythema, swelling, or drainage. Drain site drsg intact & dry. No BM yet but passing gas. No abdominal distention. Lumbar pain controlled by po meds. Denies leg pain. Some leg numbness persists, as expected - reassured.  Lab Results: No results found for this basename: WBC, HGB, HCT, PLT,  in the last 72 hours BMET No results found for this basename: NA, K, CL, CO2, GLUCOSE, BUN, CREATININE, CALCIUM,  in the last 72 hours  Studies/Results: No results found.  Assessment/Plan: Improving   LOS: 4 days  Pt requesting suppository this am. Ok per DrStern to d/c this afternoon. Rx's to chart : Dilaudid 2mg  1-2 po q4hrs prn pain #60, Valium 5mg  1-2 po tid prn spasm #50. Pt verbalizes understanding of d/c instructions and agrees to call office to schedule 3-4 wk f/u with DrStern.   Georgiann Cockeroteat, Aubrea Meixner 02/05/2014, 7:36 AM

## 2014-02-05 NOTE — Progress Notes (Signed)
PT Cancellation Note  Patient Details Name: Jason Bernard MRN: [id number] DOB: 06/07/1951   Cancelled Treatment:    Reason Eval/Treat Not Completed: Patient declined, patient planning to DC home today and does not feel that he needs anymore PT prior to DC.    Fredrich BirksRobinette, Julia Elizabeth 02/05/2014, 9:03 AM

## 2015-01-24 ENCOUNTER — Other Ambulatory Visit: Payer: Self-pay | Admitting: Neurosurgery

## 2015-01-24 DIAGNOSIS — M542 Cervicalgia: Secondary | ICD-10-CM

## 2015-01-24 DIAGNOSIS — M545 Low back pain: Secondary | ICD-10-CM

## 2015-02-04 ENCOUNTER — Ambulatory Visit
Admission: RE | Admit: 2015-02-04 | Discharge: 2015-02-04 | Disposition: A | Discharge status: Home / Self Care | Payer: BLUE CROSS/BLUE SHIELD | Source: Ambulatory Visit | Attending: Neurosurgery | Admitting: Neurosurgery

## 2015-02-04 DIAGNOSIS — M542 Cervicalgia: Secondary | ICD-10-CM

## 2015-02-04 DIAGNOSIS — M545 Low back pain: Secondary | ICD-10-CM

## 2015-02-04 MED ORDER — GADOBENATE DIMEGLUMINE 529 MG/ML IV SOLN
20.0000 mL | Freq: Once | INTRAVENOUS | Status: AC | PRN
  Administered 2015-02-04: 20 mL via INTRAVENOUS

## 2018-01-03 ENCOUNTER — Other Ambulatory Visit (HOSPITAL_BASED_OUTPATIENT_CLINIC_OR_DEPARTMENT_OTHER): Payer: Self-pay | Admitting: Neurosurgery

## 2018-01-03 DIAGNOSIS — M4316 Spondylolisthesis, lumbar region: Secondary | ICD-10-CM

## 2018-01-07 ENCOUNTER — Ambulatory Visit (HOSPITAL_BASED_OUTPATIENT_CLINIC_OR_DEPARTMENT_OTHER)
Admission: RE | Admit: 2018-01-07 | Discharge: 2018-01-07 | Disposition: A | Discharge status: Home / Self Care | Payer: Medicare Other | Source: Ambulatory Visit | Attending: Neurosurgery | Admitting: Neurosurgery

## 2018-01-07 DIAGNOSIS — Z981 Arthrodesis status: Secondary | ICD-10-CM | POA: Diagnosis not present

## 2018-01-07 DIAGNOSIS — M5126 Other intervertebral disc displacement, lumbar region: Secondary | ICD-10-CM | POA: Insufficient documentation

## 2018-01-07 DIAGNOSIS — M4316 Spondylolisthesis, lumbar region: Secondary | ICD-10-CM | POA: Insufficient documentation

## 2018-01-07 MED ORDER — GADOBENATE DIMEGLUMINE 529 MG/ML IV SOLN
20.0000 mL | Freq: Once | INTRAVENOUS | Status: AC | PRN
  Administered 2018-01-07: 17 mL via INTRAVENOUS

## 2022-04-01 ENCOUNTER — Other Ambulatory Visit: Payer: Self-pay | Admitting: Neurological Surgery

## 2022-04-08 NOTE — Pre-Procedure Instructions (Signed)
Surgical Instructions ? ? ? Your procedure is scheduled on Monday, Apr 19, 2022 at 7:30 AM. ? Report to [facility] Main Entrance "A" at 5:30 A.M., then check in with the Admitting office. ? Call this number if you have problems the morning of surgery: ? [phone] ? ? If you have any questions prior to your surgery date call [phone]: Open Monday-Friday 8am-4pm ? ? ? Remember: ? Do not eat after midnight the night before your surgery ? ?You may drink clear liquids until 4:30 AM the morning of your surgery.   ?Clear liquids allowed are: Water, Non-Citrus Juices (without pulp), Carbonated Beverages, Clear Tea, Black Coffee Only (NO MILK, CREAM OR POWDERED CREAMER of any kind), and Gatorade. ?  ? Take these medicines the morning of surgery with A SIP OF WATER: ? ?doxycycline (VIBRAMYCIN)  ? ?IF NEEDED: ?cyclobenzaprine (FLEXERIL) ?EPINEPHrine  ?pramipexole (MIRAPEX) ?pregabalin (LYRICA) ? ? ?As of today, STOP taking any Aspirin (unless otherwise instructed by your surgeon) Aleve, Naproxen, Ibuprofen, Motrin, Advil, celecoxib (CELEBREX), Goody's, BC's, all herbal medications, fish oil, and all vitamins. ? ?WHAT DO I DO ABOUT MY DIABETES MEDICATION? ? ? ?THE NIGHT BEFORE SURGERY, take 35 units of Insulin Glargine (BASAGLAR).    ? ? ?HOW TO MANAGE YOUR DIABETES ?BEFORE AND AFTER SURGERY ? ?Why is it important to control my blood sugar before and after surgery? ?Improving blood sugar levels before and after surgery helps healing and can limit problems. ?A way of improving blood sugar control is eating a healthy diet by: ? Eating less sugar and carbohydrates ? Increasing activity/exercise ? Talking with your doctor about reaching your blood sugar goals ?High blood sugars (greater than 180 mg/dL) can raise your risk of infections and slow your recovery, so you will need to focus on controlling your diabetes during the weeks before surgery. ?Make sure that the doctor who takes care of your diabetes knows about your  planned surgery including the date and location. ? ?How do I manage my blood sugar before surgery? ?Check your blood sugar at least 4 times a day, starting 2 days before surgery, to make sure that the level is not too high or low. ? ?Check your blood sugar the morning of your surgery when you wake up and every 2 hours until you get to the Short Stay unit. ? ?If your blood sugar is less than 70 mg/dL, you will need to treat for low blood sugar: ?Do not take insulin. ?Treat a low blood sugar (less than 70 mg/dL) with ? cup of clear juice (cranberry or apple), 4 glucose tablets, OR glucose gel. ?Recheck blood sugar in 15 minutes after treatment (to make sure it is greater than 70 mg/dL). If your blood sugar is not greater than 70 mg/dL on recheck, call [phone] for further instructions. ?Report your blood sugar to the short stay nurse when you get to Short Stay. ? ?If you are admitted to the hospital after surgery: ?Your blood sugar will be checked by the staff and you will probably be given insulin after surgery (instead of oral diabetes medicines) to make sure you have good blood sugar levels. ?The goal for blood sugar control after surgery is 80-180 mg/dL. ? ?         ?           ?Do NOT Smoke (Tobacco/Vaping) for 24 hours prior to your procedure. ? ?If you use a CPAP at night, you may bring your mask/headgear for your overnight stay. ?  ?  Contacts, glasses, piercing's, hearing aid's, dentures or partials may not be worn into surgery, please bring cases for these belongings.  ?  ?For patients admitted to the hospital, discharge time will be determined by your treatment team. ?  ?Patients discharged the day of surgery will not be allowed to drive home, and someone needs to stay with them for 24 hours. ? ?SURGICAL WAITING ROOM VISITATION ?Patients having surgery or a procedure may have two support people in the waiting area. ?Visitors may stay in the waiting area during the procedure and switch out with other  visitors if needed. ?Children under the age of 70 must have an adult accompany them who is not the patient. ?If the patient needs to stay at the hospital during part of their recovery, the visitor guidelines for inpatient rooms apply. ? ?Please refer to the [facility] website for the visitor guidelines for Inpatients (after your surgery is over and you are in a regular room).  ? ? ?Special instructions:   ?[facility]- Preparing For Surgery ? ?Before surgery, you can play an important role. Because skin is not sterile, your skin needs to be as free of germs as possible. You can reduce the number of germs on your skin by washing with CHG (chlorahexidine gluconate) Soap before surgery.  CHG is an antiseptic cleaner which kills germs and bonds with the skin to continue killing germs even after washing.   ? ?Oral Hygiene is also important to reduce your risk of infection.  Remember - BRUSH YOUR TEETH THE MORNING OF SURGERY WITH YOUR REGULAR TOOTHPASTE ? ?Please do not use if you have an allergy to CHG or antibacterial soaps. If your skin becomes reddened/irritated stop using the CHG.  ?Do not shave (including legs and underarms) for at least 48 hours prior to first CHG shower. It is OK to shave your face. ? ?Please follow these instructions carefully. ?  ?Shower the NIGHT BEFORE SURGERY and the MORNING OF SURGERY ? ?If you chose to wash your hair, wash your hair first as usual with your normal shampoo. ? ?After you shampoo, rinse your hair and body thoroughly to remove the shampoo. ? ?Use CHG Soap as you would any other liquid soap. You can apply CHG directly to the skin and wash gently with a scrungie or a clean washcloth.  ? ?Apply the CHG Soap to your body ONLY FROM THE NECK DOWN.  Do not use on open wounds or open sores. Avoid contact with your eyes, ears, mouth and genitals (private parts). Wash Face and genitals (private parts)  with your normal soap.  ? ?Wash thoroughly, paying special attention to the area where  your surgery will be performed. ? ?Thoroughly rinse your body with warm water from the neck down. ? ?DO NOT shower/wash with your normal soap after using and rinsing off the CHG Soap. ? ?Pat yourself dry with a CLEAN TOWEL. ? ?Wear CLEAN PAJAMAS to bed the night before surgery ? ?Place CLEAN SHEETS on your bed the night before your surgery ? ?DO NOT SLEEP WITH PETS. ? ? ?Day of Surgery: ?Take a shower with CHG soap. ?Do not wear jewelry. ?Do not wear lotions, powders, colognes, or deodorant. ?Do not shave 48 hours prior to surgery.  Men may shave face and neck. ?Do not bring valuables to the hospital.  ?[facility] is not responsible for any belongings or valuables. ?Wear Clean/Comfortable clothing the morning of surgery ?Do not apply any deodorants/lotions.   ?Remember to brush your teeth  WITH YOUR REGULAR TOOTHPASTE. ?  ?Please read over the following fact sheets that you were given. ? ?If you received a COVID test during your pre-op visit  it is requested that you wear a mask when out in public, stay away from anyone that may not be feeling well and notify your surgeon if you develop symptoms. If you have been in contact with anyone that has tested positive in the last 10 days please notify you surgeon. ?  ? ?

## 2022-04-09 ENCOUNTER — Encounter (HOSPITAL_COMMUNITY)
Admission: RE | Admit: 2022-04-09 | Discharge: 2022-04-09 | Disposition: A | Discharge status: Home / Self Care | Payer: Medicare Other | Source: Ambulatory Visit | Attending: Neurological Surgery | Admitting: Neurological Surgery

## 2022-04-09 ENCOUNTER — Encounter (HOSPITAL_COMMUNITY): Payer: Self-pay

## 2022-04-09 ENCOUNTER — Other Ambulatory Visit: Payer: Self-pay

## 2022-04-09 VITALS — BP 142/67 | HR 77 | Temp 98.2°F | Resp 17 | Ht 69.0 in | Wt 242.2 lb

## 2022-04-09 DIAGNOSIS — Z01818 Encounter for other preprocedural examination: Secondary | ICD-10-CM | POA: Insufficient documentation

## 2022-04-09 DIAGNOSIS — Z794 Long term (current) use of insulin: Secondary | ICD-10-CM | POA: Insufficient documentation

## 2022-04-09 DIAGNOSIS — E119 Type 2 diabetes mellitus without complications: Secondary | ICD-10-CM

## 2022-04-09 LAB — TYPE AND SCREEN
ABO/RH(D): O POS
Antibody Screen: NEGATIVE

## 2022-04-09 LAB — BASIC METABOLIC PANEL
Anion gap: 6 (ref 5–15)
BUN: 18 mg/dL (ref 8–23)
CO2: 27 mmol/L (ref 22–32)
Calcium: 9.3 mg/dL (ref 8.9–10.3)
Chloride: 104 mmol/L (ref 98–111)
Creatinine, Ser: 1 mg/dL (ref 0.61–1.24)
GFR, Estimated: >60 mL/min (ref >60)
Glucose, Bld: 125 mg/dL — ABNORMAL HIGH (ref 70–99)
Potassium: 4.3 mmol/L (ref 3.5–5.1)
Sodium: 137 mmol/L (ref 135–145)

## 2022-04-09 LAB — CBC
HCT: 49.8 % (ref 39.0–52.0)
Hemoglobin: 16.7 g/dL (ref 13.0–17.0)
MCH: 30 pg (ref 26.0–34.0)
MCHC: 33.5 g/dL (ref 30.0–36.0)
MCV: 89.6 fL (ref 80.0–100.0)
Platelets: 227 10*3/uL (ref 150–400)
RBC: 5.56 MIL/uL (ref 4.22–5.81)
RDW: 14.7 % (ref 11.5–15.5)
WBC: 9.4 10*3/uL (ref 4.0–10.5)
nRBC: 0 % (ref 0.0–0.2)

## 2022-04-09 LAB — HEMOGLOBIN A1C
Hgb A1c MFr Bld: 7.5 % — ABNORMAL HIGH (ref 4.8–5.6)
Mean Plasma Glucose: 168.55 mg/dL

## 2022-04-09 LAB — GLUCOSE, CAPILLARY: Glucose-Capillary: 127 mg/dL — ABNORMAL HIGH (ref 70–99)

## 2022-04-09 NOTE — Progress Notes (Signed)
PCP - Antony Haste ?Cardiologist - denies ? ?PPM/ICD - denies ? ? ?Chest x-ray - n/a ?EKG - 04/09/22 ?Stress Test - denies ?ECHO - denies ?Cardiac Cath - denies ? ?Sleep Study - has had one but was not diagnosed with sleep apnea ? ? ?Fasting Blood Sugar - 120-140 ?Checks Blood Sugar twice a week ? ?As of today, STOP taking any Aspirin (unless otherwise instructed by your surgeon) Aleve, Naproxen, Ibuprofen, Motrin, Advil, celecoxib (CELEBREX), voltaren gel, Goody's, BC's, all herbal medications, fish oil, and all vitamins. ? ?ERAS Protcol -yes ?PRE-SURGERY Ensure or G2- not ordered ? ?COVID TEST- not needed ? ? ?Anesthesia review: no ? ?Patient denies shortness of breath, fever, cough and chest pain at PAT appointment ? ? ?All instructions explained to the patient, with a verbal understanding of the material. Patient agrees to go over the instructions while at home for a better understanding. Patient also instructed to self quarantine after being tested for COVID-19. The opportunity to ask questions was provided. ? ? ?

## 2022-04-18 NOTE — Anesthesia Preprocedure Evaluation (Addendum)
Anesthesia Evaluation  ?Patient identified by MRN, date of birth, ID band ?Patient awake ? ? ? ?Reviewed: ?Allergy & Precautions, NPO status , Patient's Chart, lab work & pertinent test results ? ?Airway ?Mallampati: III ? ?TM Distance: >3 FB ?Neck ROM: Limited ? ? ? Dental ? ?(+) Teeth Intact, Dental Advisory Given, Caps, Implants ?  ?Pulmonary ?neg pulmonary ROS,  ?  ?Pulmonary exam normal ?breath sounds clear to auscultation ? ? ? ? ? ? Cardiovascular ?hypertension, Pt. on medications ?Normal cardiovascular exam ?Rhythm:Regular Rate:Normal ? ? ?  ?Neuro/Psych ?Spinal stenosis of lumbar region with neurogenic claudication ? Neuromuscular disease   ? GI/Hepatic ?negative GI ROS, Neg liver ROS,   ?Endo/Other  ?diabetes, Type 2, Insulin DependentObesity ? ? Renal/GU ?negative Renal ROS  ? ?  ?Musculoskeletal ? ?(+) Arthritis ,  ? Abdominal ?  ?Peds ? Hematology ?negative hematology ROS ?(+)   ?Anesthesia Other Findings ? ? Reproductive/Obstetrics ? ?  ? ? ? ? ? ? ? ? ? ? ? ? ? ?  ?  ? ? ? ? ? ? ? ?Anesthesia Physical ?Anesthesia Plan ? ?ASA: 3 ? ?Anesthesia Plan: General  ? ?Post-op Pain Management: Tylenol PO (pre-op)*  ? ?Induction: Intravenous ? ?PONV Risk Score and Plan: 2 and Midazolam, Dexamethasone and Ondansetron ? ?Airway Management Planned: Oral ETT and Video Laryngoscope Planned ? ?Additional Equipment:  ? ?Intra-op Plan:  ? ?Post-operative Plan: Extubation in OR ? ?Informed Consent: I have reviewed the patients History and Physical, chart, labs and discussed the procedure including the risks, benefits and alternatives for the proposed anesthesia with the patient or authorized representative who has indicated his/her understanding and acceptance.  ? ? ? ?Dental advisory given ? ?Plan Discussed with: CRNA ? ?Anesthesia Plan Comments: (2nd PIV after induction)  ? ? ? ? ? ?Anesthesia Quick Evaluation ? ?

## 2022-04-19 ENCOUNTER — Other Ambulatory Visit: Payer: Self-pay

## 2022-04-19 ENCOUNTER — Ambulatory Visit (HOSPITAL_COMMUNITY)
Admission: RE | Disposition: A | Discharge status: Home Health | Payer: Self-pay | Source: Ambulatory Visit | Attending: Neurological Surgery

## 2022-04-19 ENCOUNTER — Inpatient Hospital Stay (HOSPITAL_COMMUNITY): Payer: Medicare Other | Admitting: Anesthesiology

## 2022-04-19 ENCOUNTER — Observation Stay (HOSPITAL_COMMUNITY)
Admission: RE | Admit: 2022-04-19 | Discharge: 2022-04-20 | Disposition: A | Discharge status: Home Health | Payer: Medicare Other | Source: Ambulatory Visit | Attending: Neurological Surgery | Admitting: Neurological Surgery

## 2022-04-19 ENCOUNTER — Inpatient Hospital Stay (HOSPITAL_COMMUNITY): Payer: Medicare Other | Admitting: Vascular Surgery

## 2022-04-19 ENCOUNTER — Encounter (HOSPITAL_COMMUNITY): Payer: Self-pay | Admitting: Neurological Surgery

## 2022-04-19 ENCOUNTER — Inpatient Hospital Stay (HOSPITAL_COMMUNITY): Payer: Medicare Other

## 2022-04-19 DIAGNOSIS — M5416 Radiculopathy, lumbar region: Secondary | ICD-10-CM | POA: Diagnosis not present

## 2022-04-19 DIAGNOSIS — G959 Disease of spinal cord, unspecified: Secondary | ICD-10-CM | POA: Insufficient documentation

## 2022-04-19 DIAGNOSIS — E119 Type 2 diabetes mellitus without complications: Secondary | ICD-10-CM | POA: Insufficient documentation

## 2022-04-19 DIAGNOSIS — M48062 Spinal stenosis, lumbar region with neurogenic claudication: Secondary | ICD-10-CM | POA: Diagnosis present

## 2022-04-19 DIAGNOSIS — I10 Essential (primary) hypertension: Secondary | ICD-10-CM | POA: Diagnosis not present

## 2022-04-19 DIAGNOSIS — M4316 Spondylolisthesis, lumbar region: Secondary | ICD-10-CM | POA: Insufficient documentation

## 2022-04-19 DIAGNOSIS — Z981 Arthrodesis status: Secondary | ICD-10-CM | POA: Insufficient documentation

## 2022-04-19 DIAGNOSIS — Z01818 Encounter for other preprocedural examination: Secondary | ICD-10-CM

## 2022-04-19 LAB — GLUCOSE, CAPILLARY
Glucose-Capillary: 91 mg/dL (ref 70–99)
Glucose-Capillary: 126 mg/dL — ABNORMAL HIGH (ref 70–99)
Glucose-Capillary: 203 mg/dL — ABNORMAL HIGH (ref 70–99)
Glucose-Capillary: 277 mg/dL — ABNORMAL HIGH (ref 70–99)

## 2022-04-19 LAB — SURGICAL PCR SCREEN
MRSA, PCR: NEGATIVE
Staphylococcus aureus: NEGATIVE

## 2022-04-19 SURGERY — POSTERIOR LUMBAR FUSION 1 LEVEL
Anesthesia: General | Site: Back

## 2022-04-19 MED ORDER — MORPHINE SULFATE (PF) 2 MG/ML IV SOLN: 2.0000 mg | INTRAVENOUS | Status: DC | PRN

## 2022-04-19 MED ORDER — ORAL CARE MOUTH RINSE
15.0000 mL | Freq: Once | OROMUCOSAL | Status: AC
Start: 2022-04-19 — End: 2022-04-19

## 2022-04-19 MED ORDER — CHLORHEXIDINE GLUCONATE 0.12 % MT SOLN
OROMUCOSAL | Status: AC
  Administered 2022-04-19: 15 mL via OROMUCOSAL
  Filled 2022-04-19: qty 15

## 2022-04-19 MED ORDER — FENTANYL CITRATE (PF) 100 MCG/2ML IJ SOLN
INTRAMUSCULAR | Status: AC
  Filled 2022-04-19: qty 2

## 2022-04-19 MED ORDER — VANCOMYCIN HCL IN DEXTROSE 1-5 GM/200ML-% IV SOLN
1000.0000 mg | Freq: Once | INTRAVENOUS | Status: AC
  Administered 2022-04-19: 1000 mg via INTRAVENOUS
  Filled 2022-04-19: qty 200

## 2022-04-19 MED ORDER — POLYETHYLENE GLYCOL 3350 17 G PO PACK: 17.0000 g | PACK | Freq: Every day | ORAL | Status: DC | PRN

## 2022-04-19 MED ORDER — DIPHENHYDRAMINE HCL 50 MG/ML IJ SOLN
INTRAMUSCULAR | Status: AC
  Filled 2022-04-19: qty 1

## 2022-04-19 MED ORDER — LIDOCAINE-EPINEPHRINE 1 %-1:100000 IJ SOLN
INTRAMUSCULAR | Status: DC | PRN
  Administered 2022-04-19: 10 mL via INTRAMUSCULAR

## 2022-04-19 MED ORDER — LIDOCAINE-EPINEPHRINE 2 %-1:100000 IJ SOLN
INTRAMUSCULAR | Status: AC
  Filled 2022-04-19: qty 1

## 2022-04-19 MED ORDER — ALUM & MAG HYDROXIDE-SIMETH 200-200-20 MG/5ML PO SUSP: 30.0000 mL | Freq: Four times a day (QID) | ORAL | Status: DC | PRN

## 2022-04-19 MED ORDER — CHLORHEXIDINE GLUCONATE CLOTH 2 % EX PADS: 6.0000 | MEDICATED_PAD | Freq: Once | CUTANEOUS | Status: DC

## 2022-04-19 MED ORDER — 0.9 % SODIUM CHLORIDE (POUR BTL) OPTIME
TOPICAL | Status: DC | PRN
Start: 2022-04-19 — End: 2022-04-19
  Administered 2022-04-19: 1000 mL

## 2022-04-19 MED ORDER — FENTANYL CITRATE (PF) 250 MCG/5ML IJ SOLN
INTRAMUSCULAR | Status: DC | PRN
  Administered 2022-04-19 (×4): 50 ug via INTRAVENOUS
  Administered 2022-04-19 (×5): 25 ug via INTRAVENOUS

## 2022-04-19 MED ORDER — CYCLOBENZAPRINE HCL 10 MG PO TABS: 10.0000 mg | ORAL_TABLET | Freq: Three times a day (TID) | ORAL | Status: DC | PRN

## 2022-04-19 MED ORDER — METHOCARBAMOL 500 MG PO TABS
500.0000 mg | ORAL_TABLET | Freq: Four times a day (QID) | ORAL | Status: DC | PRN
  Administered 2022-04-19 – 2022-04-20 (×4): 500 mg via ORAL
  Filled 2022-04-19 (×4): qty 1

## 2022-04-19 MED ORDER — DIPHENHYDRAMINE HCL 50 MG/ML IJ SOLN
INTRAMUSCULAR | Status: DC | PRN
  Administered 2022-04-19: 12.5 mg via INTRAVENOUS

## 2022-04-19 MED ORDER — BUPIVACAINE HCL (PF) 0.5 % IJ SOLN
INTRAMUSCULAR | Status: AC
  Filled 2022-04-19: qty 30

## 2022-04-19 MED ORDER — ACETAMINOPHEN 500 MG PO TABS
1000.0000 mg | ORAL_TABLET | Freq: Once | ORAL | Status: AC
Start: 2022-04-19 — End: 2022-04-19
  Administered 2022-04-19: 1000 mg via ORAL
  Filled 2022-04-19: qty 2

## 2022-04-19 MED ORDER — PRAMIPEXOLE DIHYDROCHLORIDE 0.25 MG PO TABS: 0.5000 mg | ORAL_TABLET | Freq: Three times a day (TID) | ORAL | Status: DC | PRN

## 2022-04-19 MED ORDER — SODIUM CHLORIDE 0.9% FLUSH
3.0000 mL | Freq: Two times a day (BID) | INTRAVENOUS | Status: DC
  Administered 2022-04-19 (×2): 3 mL via INTRAVENOUS

## 2022-04-19 MED ORDER — ROSUVASTATIN CALCIUM 20 MG PO TABS
20.0000 mg | ORAL_TABLET | Freq: Every day | ORAL | Status: DC
  Administered 2022-04-19: 20 mg via ORAL
  Filled 2022-04-19: qty 1

## 2022-04-19 MED ORDER — SUGAMMADEX SODIUM 200 MG/2ML IV SOLN
INTRAVENOUS | Status: DC | PRN
  Administered 2022-04-19: 200 mg via INTRAVENOUS

## 2022-04-19 MED ORDER — LISINOPRIL 10 MG PO TABS
10.0000 mg | ORAL_TABLET | Freq: Every day | ORAL | Status: DC
  Administered 2022-04-19: 10 mg via ORAL
  Filled 2022-04-19: qty 1

## 2022-04-19 MED ORDER — INSULIN GLARGINE-YFGN 100 UNIT/ML ~~LOC~~ SOLN
70.0000 [IU] | Freq: Every day | SUBCUTANEOUS | Status: DC
  Administered 2022-04-19: 70 [IU] via SUBCUTANEOUS
  Filled 2022-04-19 (×2): qty 0.7

## 2022-04-19 MED ORDER — THROMBIN (RECOMBINANT) 20000 UNITS EX SOLR
CUTANEOUS | Status: AC
  Filled 2022-04-19: qty 20000

## 2022-04-19 MED ORDER — INSULIN ASPART 100 UNIT/ML IJ SOLN: 0.0000 [IU] | INTRAMUSCULAR | Status: DC | PRN

## 2022-04-19 MED ORDER — LIDOCAINE 2% (20 MG/ML) 5 ML SYRINGE
INTRAMUSCULAR | Status: AC
  Filled 2022-04-19: qty 5

## 2022-04-19 MED ORDER — MENTHOL 3 MG MT LOZG: 1.0000 | LOZENGE | OROMUCOSAL | Status: DC | PRN

## 2022-04-19 MED ORDER — ONDANSETRON HCL 4 MG/2ML IJ SOLN
INTRAMUSCULAR | Status: DC | PRN
  Administered 2022-04-19: 4 mg via INTRAVENOUS

## 2022-04-19 MED ORDER — THROMBIN 5000 UNITS EX SOLR
CUTANEOUS | Status: AC
  Filled 2022-04-19: qty 5000

## 2022-04-19 MED ORDER — PROPOFOL 10 MG/ML IV BOLUS
INTRAVENOUS | Status: AC
  Filled 2022-04-19: qty 20

## 2022-04-19 MED ORDER — CHLORHEXIDINE GLUCONATE 0.12 % MT SOLN: 15.0000 mL | Freq: Once | OROMUCOSAL | Status: AC

## 2022-04-19 MED ORDER — DOCUSATE SODIUM 100 MG PO CAPS
100.0000 mg | ORAL_CAPSULE | Freq: Two times a day (BID) | ORAL | Status: DC
  Administered 2022-04-19 (×2): 100 mg via ORAL
  Filled 2022-04-19 (×2): qty 1

## 2022-04-19 MED ORDER — ACETAMINOPHEN 650 MG RE SUPP: 650.0000 mg | RECTAL | Status: DC | PRN

## 2022-04-19 MED ORDER — ACETAMINOPHEN 325 MG PO TABS: 650.0000 mg | ORAL_TABLET | ORAL | Status: DC | PRN

## 2022-04-19 MED ORDER — PHENYLEPHRINE 80 MCG/ML (10ML) SYRINGE FOR IV PUSH (FOR BLOOD PRESSURE SUPPORT)
PREFILLED_SYRINGE | INTRAVENOUS | Status: DC | PRN
  Administered 2022-04-19: 80 ug via INTRAVENOUS
  Administered 2022-04-19: 120 ug via INTRAVENOUS

## 2022-04-19 MED ORDER — VANCOMYCIN HCL IN DEXTROSE 1-5 GM/200ML-% IV SOLN
INTRAVENOUS | Status: AC
  Filled 2022-04-19: qty 200

## 2022-04-19 MED ORDER — LACTATED RINGERS IV SOLN: INTRAVENOUS | Status: DC | PRN

## 2022-04-19 MED ORDER — DEXAMETHASONE SODIUM PHOSPHATE 10 MG/ML IJ SOLN
INTRAMUSCULAR | Status: DC | PRN
  Administered 2022-04-19: 8 mg via INTRAVENOUS

## 2022-04-19 MED ORDER — INSULIN ASPART 100 UNIT/ML IJ SOLN
0.0000 [IU] | Freq: Three times a day (TID) | INTRAMUSCULAR | Status: DC
  Administered 2022-04-19: 7 [IU] via SUBCUTANEOUS
  Administered 2022-04-20 (×2): 3 [IU] via SUBCUTANEOUS

## 2022-04-19 MED ORDER — SENNA 8.6 MG PO TABS
1.0000 | ORAL_TABLET | Freq: Two times a day (BID) | ORAL | Status: DC
  Administered 2022-04-19 (×2): 8.6 mg via ORAL
  Filled 2022-04-19 (×2): qty 1

## 2022-04-19 MED ORDER — GELATIN ABSORBABLE 100 EX MISC: CUTANEOUS | Status: DC | PRN

## 2022-04-19 MED ORDER — MIDAZOLAM HCL 2 MG/2ML IJ SOLN
INTRAMUSCULAR | Status: AC
  Filled 2022-04-19: qty 2

## 2022-04-19 MED ORDER — SODIUM CHLORIDE 0.9% FLUSH: 3.0000 mL | INTRAVENOUS | Status: DC | PRN

## 2022-04-19 MED ORDER — ROCURONIUM BROMIDE 10 MG/ML (PF) SYRINGE
PREFILLED_SYRINGE | INTRAVENOUS | Status: DC | PRN
  Administered 2022-04-19: 10 mg via INTRAVENOUS
  Administered 2022-04-19 (×2): 20 mg via INTRAVENOUS
  Administered 2022-04-19: 40 mg via INTRAVENOUS
  Administered 2022-04-19: 60 mg via INTRAVENOUS

## 2022-04-19 MED ORDER — ONDANSETRON HCL 4 MG/2ML IJ SOLN
INTRAMUSCULAR | Status: AC
  Filled 2022-04-19: qty 2

## 2022-04-19 MED ORDER — DEXAMETHASONE SODIUM PHOSPHATE 10 MG/ML IJ SOLN
INTRAMUSCULAR | Status: AC
  Filled 2022-04-19: qty 1

## 2022-04-19 MED ORDER — BUPIVACAINE HCL (PF) 0.5 % IJ SOLN
INTRAMUSCULAR | Status: DC | PRN
  Administered 2022-04-19: 25 mL

## 2022-04-19 MED ORDER — BISACODYL 10 MG RE SUPP: 10.0000 mg | Freq: Every day | RECTAL | Status: DC | PRN

## 2022-04-19 MED ORDER — OXYCODONE-ACETAMINOPHEN 5-325 MG PO TABS
1.0000 | ORAL_TABLET | ORAL | Status: DC | PRN
  Administered 2022-04-19 – 2022-04-20 (×6): 2 via ORAL
  Filled 2022-04-19 (×6): qty 2

## 2022-04-19 MED ORDER — PROPOFOL 10 MG/ML IV BOLUS
INTRAVENOUS | Status: DC | PRN
  Administered 2022-04-19 (×2): 10 mg via INTRAVENOUS
  Administered 2022-04-19: 150 mg via INTRAVENOUS

## 2022-04-19 MED ORDER — MIDAZOLAM HCL 2 MG/2ML IJ SOLN
INTRAMUSCULAR | Status: DC | PRN
  Administered 2022-04-19: 1 mg via INTRAVENOUS

## 2022-04-19 MED ORDER — ONDANSETRON HCL 4 MG PO TABS
4.0000 mg | ORAL_TABLET | Freq: Four times a day (QID) | ORAL | Status: DC | PRN
  Administered 2022-04-20: 4 mg via ORAL
  Filled 2022-04-19: qty 1

## 2022-04-19 MED ORDER — SODIUM CHLORIDE 0.9 % IV SOLN: 250.0000 mL | INTRAVENOUS | Status: DC

## 2022-04-19 MED ORDER — LIDOCAINE-EPINEPHRINE 1 %-1:100000 IJ SOLN
INTRAMUSCULAR | Status: AC
  Filled 2022-04-19: qty 1

## 2022-04-19 MED ORDER — PREGABALIN 100 MG PO CAPS: 100.0000 mg | ORAL_CAPSULE | Freq: Three times a day (TID) | ORAL | Status: DC | PRN

## 2022-04-19 MED ORDER — ALBUMIN HUMAN 5 % IV SOLN
INTRAVENOUS | Status: DC | PRN
Start: 2022-04-19 — End: 2022-04-19

## 2022-04-19 MED ORDER — VANCOMYCIN HCL IN DEXTROSE 1-5 GM/200ML-% IV SOLN
1000.0000 mg | INTRAVENOUS | Status: AC
  Administered 2022-04-19: 1000 mg via INTRAVENOUS

## 2022-04-19 MED ORDER — LIDOCAINE 2% (20 MG/ML) 5 ML SYRINGE
INTRAMUSCULAR | Status: DC | PRN
  Administered 2022-04-19: 100 mg via INTRAVENOUS

## 2022-04-19 MED ORDER — ONDANSETRON HCL 4 MG/2ML IJ SOLN: 4.0000 mg | Freq: Four times a day (QID) | INTRAMUSCULAR | Status: DC | PRN

## 2022-04-19 MED ORDER — METHOCARBAMOL 1000 MG/10ML IJ SOLN: 500.0000 mg | Freq: Four times a day (QID) | INTRAVENOUS | Status: DC | PRN

## 2022-04-19 MED ORDER — PHENOL 1.4 % MT LIQD: 1.0000 | OROMUCOSAL | Status: DC | PRN

## 2022-04-19 MED ORDER — TRAZODONE HCL 50 MG PO TABS
100.0000 mg | ORAL_TABLET | Freq: Every day | ORAL | Status: DC
Start: 2022-04-19 — End: 2022-04-20
  Administered 2022-04-19: 100 mg via ORAL
  Filled 2022-04-19: qty 2

## 2022-04-19 MED ORDER — PHENYLEPHRINE HCL-NACL 20-0.9 MG/250ML-% IV SOLN
INTRAVENOUS | Status: DC | PRN
Start: 2022-04-19 — End: 2022-04-19
  Administered 2022-04-19: 40 ug/min via INTRAVENOUS

## 2022-04-19 MED ORDER — ONDANSETRON HCL 4 MG/2ML IJ SOLN: 4.0000 mg | Freq: Once | INTRAMUSCULAR | Status: DC | PRN

## 2022-04-19 MED ORDER — LACTATED RINGERS IV SOLN: INTRAVENOUS | Status: DC

## 2022-04-19 MED ORDER — TAMSULOSIN HCL 0.4 MG PO CAPS
0.4000 mg | ORAL_CAPSULE | Freq: Every day | ORAL | Status: DC
  Administered 2022-04-19: 0.4 mg via ORAL
  Filled 2022-04-19: qty 1

## 2022-04-19 MED ORDER — GELATIN ABSORBABLE MT POWD: OROMUCOSAL | Status: DC | PRN

## 2022-04-19 MED ORDER — FENTANYL CITRATE (PF) 100 MCG/2ML IJ SOLN: 25.0000 ug | INTRAMUSCULAR | Status: DC | PRN

## 2022-04-19 MED ORDER — BASAGLAR KWIKPEN 100 UNIT/ML ~~LOC~~ SOPN
70.0000 [IU] | PEN_INJECTOR | Freq: Every day | SUBCUTANEOUS | Status: DC
Start: 2022-04-19 — End: 2022-04-19

## 2022-04-19 MED ORDER — FENTANYL CITRATE (PF) 250 MCG/5ML IJ SOLN
INTRAMUSCULAR | Status: AC
  Filled 2022-04-19: qty 5

## 2022-04-19 MED ORDER — FLEET ENEMA 7-19 GM/118ML RE ENEM: 1.0000 | ENEMA | Freq: Once | RECTAL | Status: DC | PRN

## 2022-04-19 MED ORDER — ROCURONIUM BROMIDE 10 MG/ML (PF) SYRINGE
PREFILLED_SYRINGE | INTRAVENOUS | Status: AC
  Filled 2022-04-19: qty 10

## 2022-04-19 SURGICAL SUPPLY — 78 items
BAG COUNTER SPONGE SURGICOUNT (BAG) ×2 IMPLANT
BASKET BONE COLLECTION (BASKET) ×2 IMPLANT
BLADE BONE MILL MEDIUM (MISCELLANEOUS) ×1 IMPLANT
BLADE CLIPPER SURG (BLADE) IMPLANT
BONE CANC CHIPS 20CC PCAN1/4 (Bone Implant) ×2 IMPLANT
BUR MATCHSTICK NEURO 3.0 LAGG (BURR) ×2 IMPLANT
CAGE MAS PLIF 9X9X23-8 LUMBAR (Cage) ×2 IMPLANT
CANISTER SUCT 3000ML PPV (MISCELLANEOUS) ×2 IMPLANT
CHIPS CANC BONE 20CC PCAN1/4 (Bone Implant) ×1 IMPLANT
CNTNR URN SCR LID CUP LEK RST (MISCELLANEOUS) ×1 IMPLANT
CONT SPEC 4OZ STRL OR WHT (MISCELLANEOUS) ×2
COVER BACK TABLE 60X90IN (DRAPES) ×2 IMPLANT
DECANTER SPIKE VIAL GLASS SM (MISCELLANEOUS) ×2 IMPLANT
DERMABOND ADHESIVE PROPEN (GAUZE/BANDAGES/DRESSINGS) ×1
DERMABOND ADVANCED (GAUZE/BANDAGES/DRESSINGS) ×1
DERMABOND ADVANCED .7 DNX12 (GAUZE/BANDAGES/DRESSINGS) ×1 IMPLANT
DERMABOND ADVANCED .7 DNX6 (GAUZE/BANDAGES/DRESSINGS) IMPLANT
DEVICE DISSECT PLASMABLAD 3.0S (MISCELLANEOUS) ×1 IMPLANT
DRAPE C-ARM 42X72 X-RAY (DRAPES) ×4 IMPLANT
DRAPE HALF SHEET 40X57 (DRAPES) IMPLANT
DRAPE LAPAROTOMY 100X72X124 (DRAPES) ×2 IMPLANT
DRSG OPSITE POSTOP 4X8 (GAUZE/BANDAGES/DRESSINGS) ×1 IMPLANT
DURAPREP 26ML APPLICATOR (WOUND CARE) ×2 IMPLANT
DURASEAL APPLICATOR TIP (TIP) IMPLANT
DURASEAL SPINE SEALANT 3ML (MISCELLANEOUS) IMPLANT
ELECT REM PT RETURN 9FT ADLT (ELECTROSURGICAL) ×2
ELECTRODE REM PT RTRN 9FT ADLT (ELECTROSURGICAL) ×1 IMPLANT
GAUZE 4X4 16PLY ~~LOC~~+RFID DBL (SPONGE) IMPLANT
GAUZE SPONGE 4X4 12PLY STRL (GAUZE/BANDAGES/DRESSINGS) ×2 IMPLANT
GAUZE SPONGE 4X4 12PLY STRL LF (GAUZE/BANDAGES/DRESSINGS) ×1 IMPLANT
GLOVE BIOGEL PI IND STRL 7.5 (GLOVE) IMPLANT
GLOVE BIOGEL PI IND STRL 8 (GLOVE) IMPLANT
GLOVE BIOGEL PI IND STRL 8.5 (GLOVE) ×2 IMPLANT
GLOVE BIOGEL PI INDICATOR 7.5 (GLOVE) ×1
GLOVE BIOGEL PI INDICATOR 8 (GLOVE) ×1
GLOVE BIOGEL PI INDICATOR 8.5 (GLOVE) ×2
GLOVE ECLIPSE 7.5 STRL STRAW (GLOVE) ×1 IMPLANT
GLOVE ECLIPSE 8.5 STRL (GLOVE) ×4 IMPLANT
GLOVE SURG SS PI 8.0 STRL IVOR (GLOVE) ×1 IMPLANT
GOWN STRL REUS W/ TWL LRG LVL3 (GOWN DISPOSABLE) IMPLANT
GOWN STRL REUS W/ TWL XL LVL3 (GOWN DISPOSABLE) IMPLANT
GOWN STRL REUS W/TWL 2XL LVL3 (GOWN DISPOSABLE) ×5 IMPLANT
GOWN STRL REUS W/TWL LRG LVL3 (GOWN DISPOSABLE)
GOWN STRL REUS W/TWL XL LVL3 (GOWN DISPOSABLE)
GRAFT BNE CANC CHIPS 1-8 20CC (Bone Implant) IMPLANT
GRAFT BONE PROTEIOS LRG 5CC (Orthopedic Implant) ×1 IMPLANT
HEMOSTAT POWDER KIT SURGIFOAM (HEMOSTASIS) IMPLANT
KIT BASIN OR (CUSTOM PROCEDURE TRAY) ×2 IMPLANT
KIT GRAFTMAG DEL NEURO DISP (NEUROSURGERY SUPPLIES) IMPLANT
KIT TURNOVER KIT B (KITS) ×2 IMPLANT
MILL BONE PREP (MISCELLANEOUS) ×2 IMPLANT
NEEDLE HYPO 22GX1.5 SAFETY (NEEDLE) ×2 IMPLANT
NS IRRIG 1000ML POUR BTL (IV SOLUTION) ×2 IMPLANT
PACK LAMINECTOMY NEURO (CUSTOM PROCEDURE TRAY) ×2 IMPLANT
PAD ARMBOARD 7.5X6 YLW CONV (MISCELLANEOUS) ×6 IMPLANT
PATTIES SURGICAL .5 X1 (DISPOSABLE) ×2 IMPLANT
PLASMABLADE 3.0S (MISCELLANEOUS) ×2
ROD CONNECTOR OPEN 5.5X5.5 (Rod) ×2 IMPLANT
ROD PREBENT LATERAL OFFSET 5.5 (Rod) ×2 IMPLANT
ROD TEMPLATE ASF (Rod) ×1 IMPLANT
SCREW LOCK (Screw) ×12 IMPLANT
SCREW LOCK 100X5.5X OPN (Screw) IMPLANT
SCREW POLY 45X6.5 (Screw) IMPLANT
SCREW POLY 6.5X45MM (Screw) ×4 IMPLANT
SPONGE SURGIFOAM ABS GEL 100 (HEMOSTASIS) ×2 IMPLANT
SPONGE T-LAP 4X18 ~~LOC~~+RFID (SPONGE) IMPLANT
SUT PROLENE 6 0 BV (SUTURE) IMPLANT
SUT VIC AB 1 CT1 18XBRD ANBCTR (SUTURE) ×1 IMPLANT
SUT VIC AB 1 CT1 8-18 (SUTURE) ×2
SUT VIC AB 2-0 CP2 18 (SUTURE) ×2 IMPLANT
SUT VIC AB 3-0 SH 8-18 (SUTURE) ×2 IMPLANT
SUT VIC AB 4-0 RB1 18 (SUTURE) ×2 IMPLANT
SYR 3ML LL SCALE MARK (SYRINGE) ×8 IMPLANT
TAPE CLOTH SURG 4X10 WHT LF (GAUZE/BANDAGES/DRESSINGS) ×1 IMPLANT
TOWEL GREEN STERILE (TOWEL DISPOSABLE) ×2 IMPLANT
TOWEL GREEN STERILE FF (TOWEL DISPOSABLE) ×2 IMPLANT
TRAY FOLEY MTR SLVR 16FR STAT (SET/KITS/TRAYS/PACK) ×2 IMPLANT
WATER STERILE IRR 1000ML POUR (IV SOLUTION) ×2 IMPLANT

## 2022-04-19 NOTE — Anesthesia Procedure Notes (Signed)
Procedure Name: Intubation ?Date/Time: 04/19/2022 8:09 AM ?Performed by: Erick Colace, CRNA ?Pre-anesthesia Checklist: Patient identified, Emergency Drugs available, Suction available and Patient being monitored ?Patient Re-evaluated:Patient Re-evaluated prior to induction ?Oxygen Delivery Method: Circle system utilized ?Preoxygenation: Pre-oxygenation with 100% oxygen ?Induction Type: IV induction ?Ventilation: Mask ventilation without difficulty and Oral airway inserted - appropriate to patient size ?Laryngoscope Size: Glidescope and 4 ?Grade View: Grade I ?Tube type: Oral ?Number of attempts: 1 ?Airway Equipment and Method: Stylet and Oral airway ?Placement Confirmation: ETT inserted through vocal cords under direct vision, positive ETCO2 and breath sounds checked- equal and bilateral ?Secured at: 23 cm ?Tube secured with: Tape ?Dental Injury: Teeth and Oropharynx as per pre-operative assessment  ?Comments: Elective glidescope ? ? ? ? ?

## 2022-04-19 NOTE — Progress Notes (Signed)
Orthopedic Tech Progress Note ?Patient Details:  ?Jason Bernard ?March 16, 1951 ?[id number] ? ?Ortho Devices ?Type of Ortho Device: Lumbar corsett ?Ortho Device/Splint Location: BACK ?Ortho Device/Splint Interventions: Ordered ?  ?Post Interventions ?Patient Tolerated: Well ?Instructions Provided: Care of device ? ?[name] ?04/19/2022, 2:19 PM ? ?
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
## 2022-04-19 NOTE — Transfer of Care (Signed)
Immediate Anesthesia Transfer of Care Note ? ?Patient: Jason Bernard ? ?Procedure(s) Performed: L2-3 PLIF (Back) ? ?Patient Location: PACU ? ?Anesthesia Type:General ? ?Level of Consciousness: drowsy ? ?Airway & Oxygen Therapy: Patient Spontanous Breathing ? ?Post-op Assessment: Report given to RN and Post -op Vital signs reviewed and stable ? ?Post vital signs: Reviewed and stable ? ?Last Vitals:  ?Vitals Value Taken Time  ?BP 139/67 04/19/22 1305  ?Temp    ?Pulse 88 04/19/22 1306  ?Resp 15 04/19/22 1306  ?SpO2 99 % 04/19/22 1306  ?Vitals shown include unvalidated device data. ? ?Last Pain:  ?Vitals:  ? 04/19/22 0647  ?TempSrc:   ?PainSc: 2   ?   ? ?Patients Stated Pain Goal: 3 (04/19/22 [id number]) ? ?Complications: No notable events documented. ?

## 2022-04-19 NOTE — TOC CM/SW Note (Signed)
Referral made from MD office to [facility].  ? ?Jason Bernard will follow for home health orders.  ? ?DME will be provided by 3 c staff. ? ?No other TOC needs identified at this time.  ? ? ? ? ?

## 2022-04-19 NOTE — Op Note (Signed)
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  History of fusion L3 to sacrum ?Postoperative diagnosis: Same ?Procedure: Decompression L2-L3 with laminectomy requiring more work than for simple interbody technique.  Posterior lumbar interbody arthrodesis L2-L3 with peek spacers local autograft allograft and Proteus.  Posterolateral arthrodesis with local autograft allograft and Proteus.  Extension of pedicle fixation to include L2-L3 to sacrum. ?Surgeon: Kristeen Miss  ?Anesthesia: General endotracheal ?Occasions: Jason Bernard is a 71 year old individual whose had a previous decompression and fusion from L3 to the sacrum by Dr. Vertell Limber back in 2015.  Patient has had increasing back pain and weakness.  He has had a previous cervical spine spondylitic stenosis secondary to myelopathy.  He has an MRI from November 2022 that demonstrates a severe stenosis at the level of L2-L3 and he is now admitted to undergo surgical decompression. ? ?Procedure: Patient was brought to the operating room supine on the stretcher.  After the smooth induction of general endotracheal anesthesia, he was carefully turned prone.  The back was prepped with alcohol and DuraPrep and draped in a sterile fashion.  Superior portion of the incision was infiltrated with lidocaine and Marcaine and then a vertical incision was created and carried down to the lumbodorsal fascia.  Fascia was opened on either side of midline and dissection was carried down to expose the first notable spinous process.  Lateral to the spinous process then the old hardware was identified.  The superior screw and L3 was identified.  Tissue below this and the rod between L3 and L4 were exposed.  This was done on either side.  The soft tissues were cleared from around the rod.  Then a W connector was placed onto the rod with the empty nest being medial.  This was done bilaterally.  Dissection was then carried  cephalad to expose the laminar arch of L2 out to and including the entirety of the facet at the L2-L3 level.  The transverse process of L3 was decorticated and L2 was also decorticated and packed away for later use and grafting.  Laminectomy was then created removing the inferior margin lamina of L2 out to and including the entirety of the facet at L2-L3.  This bone was saved for autograft.  By relieving all of bone the lamina of the common dural tube was exposed.  This allowed for good decompression of the central canal and then the lateral recesses.  The superior articular process was also taken down to expose the region of the disc space at L2-L3.  The disc space was noted to be moderately degenerated.  15 blade was used to open up the space first on the left side then on the right side and a discectomy was completed.  Total discectomy was performed so as to allow some distraction of the disc space at L2-L3.  The endplates were decorticated thoroughly both medially and laterally and once the entire disc space was decorticated the space was measured for an appropriately sized spacer.  It was felt that a 9 x 23 mm spacer with 8 degrees lordosis would fit best.  This was packed with autograft allograft and Proteus the allograft was 20 cc of cortical cancellous chips which were put through a grinder.  A total of 9 cc of the grafting mixture was packed into the interspace along with the 2 spacers.  The lateral gutters which had been previously decorticated were then packed with the remaining 9 cc on each side of bone  graft.  Then pedicle entry sites were chosen at L2.  6.5 x 45 mm screws were placed under fluoroscopic guidance into the pedicles of L2 bilaterally.  To connect this to the W connector 0 rod was used and cut to the appropriate length.  This was placed in a neutral construct.  Final radiographs identified good alignment of the lumbar spine good decompression and good sagittal alignment also.  Once all the  graft was packed care was taken to make sure that the common dural tube the L2 nerve root superiorly and the L3 nerve root inferiorly were well decompressed the common dural tube was intact and well decompressed and hemostasis was achieved in the soft tissues.  The lumbodorsal fascia was then closed with #1 Vicryl in interrupted fashion 2-0 Vicryl was used in subcutaneous tissues 3-0 Vicryl and 4-0 Vicryl were used in the subcuticular tissues.  Dermabond was placed on the skin along with a dry sterile dressing.  Patient was returned to the recovery room in stable condition.  Blood loss was estimated 600 cc. ?

## 2022-04-19 NOTE — Progress Notes (Signed)
Pharmacy Antibiotic Note ? ?Jason Bernard is a 71 y.o. male admitted on 04/19/2022 with surgical prophylaxis.  Pharmacy has been consulted for vancomycin dosing. ? ?Patient with throat swelling to penicillin. No drain per RN. Received 1g x1 at 8am. ClCr 84 ml/min.  ? ?Plan: ?Vancomycin 1g x1 at 8pm  ? ?Height: 5\' 9"  (175.3 cm) ?Weight: 109.9 kg (242 lb 3.2 oz) ?IBW/kg (Calculated) : 70.7 ? ?Temp (24hrs), Avg:98.1 ?F (36.7 ?C), Min:97.9 ?F (36.6 ?C), Max:98.2 ?F (36.8 ?C) ? ?No results for input(s): WBC, CREATININE, LATICACIDVEN, VANCOTROUGH, VANCOPEAK, VANCORANDOM, GENTTROUGH, GENTPEAK, GENTRANDOM, TOBRATROUGH, TOBRAPEAK, TOBRARND, AMIKACINPEAK, AMIKACINTROU, AMIKACIN in the last 168 hours.  ?Estimated Creatinine Clearance: 84 mL/min (by C-G formula based on SCr of 1 mg/dL).   ? ?Allergies  ?Allergen Reactions  ? Ciprofloxacin Hcl Hives  ? Bee Venom Swelling  ?  Hands & facial swelling/ throat tightens  ? Clindamycin/Lincomycin Itching, Swelling and Rash  ?  swelling in throat ? ?  ? Penicillins Swelling and Rash  ?  Swelling throat  ? ?  ?Thank you for allowing pharmacy to be a part of this patient?s care. ? ? , PharmD, BCPS, BCCP ?Clinical Pharmacist ? ?Please check AMION for all [facility] phone numbers ?After 10:00 PM, call Main Pharmacy [phone] ? ?

## 2022-04-19 NOTE — Anesthesia Postprocedure Evaluation (Signed)
Anesthesia Post Note ? ?Patient: Jason Bernard ? ?Procedure(s) Performed: L2-3 PLIF (Back) ? ?  ? ?Patient location during evaluation: PACU ?Anesthesia Type: General ?Level of consciousness: awake and alert ?Pain management: pain level controlled ?Vital Signs Assessment: post-procedure vital signs reviewed and stable ?Respiratory status: spontaneous breathing, nonlabored ventilation, respiratory function stable and patient connected to nasal cannula oxygen ?Cardiovascular status: blood pressure returned to baseline and stable ?Postop Assessment: no apparent nausea or vomiting ?Anesthetic complications: no ? ? ?No notable events documented. ? ?Last Vitals:  ?Vitals:  ? 04/19/22 1335 04/19/22 1403  ?BP: 129/65 (!) 144/68  ?Pulse: 82 83  ?Resp: 15 18  ?Temp:  36.6 ?C  ?SpO2: 95% 96%  ?  ?Last Pain:  ?Vitals:  ? 04/19/22 1607  ?TempSrc:   ?PainSc: Asleep  ? ? ?  ?  ?  ?  ?  ?  ? ?Collene Schlichter ? ? ? ? ?

## 2022-04-19 NOTE — Progress Notes (Signed)
Patient ID: Jason Bernard, male   DOB: 1951/10/03, 71 y.o.   MRN: [id number] ?Vital signs are stable ?Motor function is intact ?Patient is reasonably comfortable ?Has some right leg pain ?We will ambulate overnight ?Stable ?

## 2022-04-19 NOTE — Progress Notes (Signed)
Inpatient Diabetes Program Recommendations ? ?AACE/ADA: New Consensus Statement on Inpatient Glycemic Control (2015) ? ?Target Ranges:  Prepandial:   less than 140 mg/dL ?     Peak postprandial:   less than 180 mg/dL (1-2 hours) ?     Critically ill patients:  140 - 180 mg/dL  ? ?Lab Results  ?Component Value Date  ? GLUCAP 126 (H) 04/19/2022  ? HGBA1C 7.5 (H) 04/09/2022  ? ? ?Review of Glycemic Control ? Latest Reference Range & Units 04/19/22 06:13 04/19/22 13:07  ?Glucose-Capillary 70 - 99 mg/dL 91 158 (H)  ?(H): Data is abnormally high ?Diabetes history: Type 2 DM ?Outpatient Diabetes medications: Basaglar 70 units QHS ?Current orders for Inpatient glycemic control: Novolog 0-7 units Q2 PRN ?Decadron 8 mg x 1 ? ?Inpatient Diabetes Program Recommendations:   ? ?Following procedure consider: ?-Semglee 25 units QHS ?-Novolog 0-20 units TID & HS (if to have diet order) ? ?Thanks, ?Lujean Rave, MSN, RNC-OB ?Diabetes Coordinator ?[phone] (8a-5p) ? ? ? ? ?

## 2022-04-20 DIAGNOSIS — M48062 Spinal stenosis, lumbar region with neurogenic claudication: Secondary | ICD-10-CM | POA: Diagnosis not present

## 2022-04-20 LAB — GLUCOSE, CAPILLARY
Glucose-Capillary: 146 mg/dL — ABNORMAL HIGH (ref 70–99)
Glucose-Capillary: 127 mg/dL — ABNORMAL HIGH (ref 70–99)

## 2022-04-20 MED ORDER — OXYCODONE-ACETAMINOPHEN 5-325 MG PO TABS: 1.0000 | ORAL_TABLET | ORAL | 0 refills | Status: AC | PRN

## 2022-04-20 MED ORDER — MOXIFLOXACIN HCL 400 MG PO TABS: 400.0000 mg | ORAL_TABLET | Freq: Every day | ORAL | 0 refills | Status: AC

## 2022-04-20 MED ORDER — CYCLOBENZAPRINE HCL 10 MG PO TABS: 10.0000 mg | ORAL_TABLET | Freq: Three times a day (TID) | ORAL | 5 refills | Status: AC | PRN

## 2022-04-20 MED FILL — Thrombin (Recombinant) For Soln 20000 Unit: CUTANEOUS | Qty: 1 | Status: AC

## 2022-04-20 NOTE — Plan of Care (Signed)
Pt doing well. Pt and wife given D/C instructions with verbal understanding. Rx's were sent to the pharmacy by MD. Pt's incision is clean and dry with no sign of infection. Pt's IV was removed prior to D/C. Pt D/C'd home via wheelchair per MD order. Pt is stable @ D/C and has no other needs at this time. Aryssa Rosamond, RN  

## 2022-04-20 NOTE — Evaluation (Signed)
Occupational Therapy Evaluation ?Patient Details ?Name: Jason Bernard ?MRN: [id number] ?DOB: June 04, 1951 ?Today's Date: 04/20/2022 ? ? ?History of Present Illness Pt is a 71 y/o M s/p L2-3 PLIF. PMH includes L3-4 decompressoin and fusion.  ? ?Clinical Impression ?  ?Pt reports independence at baseline with ADLs and functional mobility. Lives with wife who can assist at d/c. Pt mod I-min A for ADLs this session, mod I for bed mobility using log rolling technique, supervision for transfers without AD. Pt educated on compensatory strategies for ADLs, precautions, and brace wear schedule, pt verbalizes and demo's understanding during session. Pt presenting with impairments listed below, will follow acutely. Recommend d/c home with assistance. ?   ? ?Recommendations for follow up therapy are one component of a multi-disciplinary discharge planning process, led by the attending physician.  Recommendations may be updated based on patient status, additional functional criteria and insurance authorization.  ? ?Follow Up Recommendations ? No OT follow up  ?  ?Assistance Recommended at Discharge Set up Supervision/Assistance  ?Patient can return home with the following A little help with walking and/or transfers;A little help with bathing/dressing/bathroom;Assistance with cooking/housework;Help with stairs or ramp for entrance;Assist for transportation ? ?  ?Functional Status Assessment ? Patient has had a recent decline in their functional status and demonstrates the ability to make significant improvements in function in a reasonable and predictable amount of time.  ?Equipment Recommendations ? None recommended by OT;Other (comment) (pt has all necesary DME)  ?  ?Recommendations for Other Services PT consult ? ? ?  ?Precautions / Restrictions Precautions ?Precautions: Back ?Precaution Booklet Issued: Yes (comment) ?Precaution Comments: educated pt on 3/3 back precautions ?Required Braces or Orthoses: Spinal Brace ?Spinal  Brace: Applied in sitting position ?Restrictions ?Weight Bearing Restrictions: No  ? ?  ? ?Mobility Bed Mobility ?Overal bed mobility: Modified Independent ?  ?  ?  ?  ?  ?  ?General bed mobility comments: use of log rolling technique ?  ? ?Transfers ?Overall transfer level: Needs assistance ?Equipment used: None ?Transfers: Sit to/from Stand ?Sit to Stand: Supervision ?  ?  ?  ?  ?  ?General transfer comment: increased time stand > sit due to pain ?  ? ?  ?Balance Overall balance assessment: Mild deficits observed, not formally tested ?  ?  ?  ?  ?  ?  ?  ?  ?  ?  ?  ?  ?  ?  ?  ?  ?  ?  ?   ? ?ADL either performed or assessed with clinical judgement  ? ?ADL Overall ADL's : Needs assistance/impaired ?Eating/Feeding: Modified independent;Sitting ?  ?Grooming: Modified independent;Sitting ?  ?Upper Body Bathing: Supervision/ safety;Sitting ?  ?Lower Body Bathing: Minimal assistance;Sitting/lateral leans ?  ?Upper Body Dressing : Modified independent;Sitting ?Upper Body Dressing Details (indicate cue type and reason): to don gown ?Lower Body Dressing: Minimal assistance;Sit to/from stand;Sitting/lateral leans ?Lower Body Dressing Details (indicate cue type and reason): to don pants ?Toilet Transfer: Supervision/safety;Ambulation;Regular Toilet ?Toilet Transfer Details (indicate cue type and reason): + use of grab bars ?Toileting- Clothing Manipulation and Hygiene: Modified independent;Sitting/lateral lean ?  ?  ?  ?Functional mobility during ADLs: Supervision/safety ?   ? ? ? ?Vision   ?Vision Assessment?: No apparent visual deficits  ?   ?Perception   ?  ?Praxis   ?  ? ?Pertinent Vitals/Pain Pain Assessment ?Pain Assessment: Faces ?Pain Score: 3  ?Faces Pain Scale: Hurts a little bit ?Pain Location: back ?Pain Descriptors /  Indicators: Discomfort, Constant ?Pain Intervention(s): Limited activity within patient's tolerance, Monitored during session, Premedicated before session  ? ? ? ?Hand Dominance   ?   ?Extremity/Trunk Assessment Upper Extremity Assessment ?Upper Extremity Assessment: Overall WFL for tasks assessed ?  ?Lower Extremity Assessment ?Lower Extremity Assessment: Defer to PT evaluation ?  ?Cervical / Trunk Assessment ?Cervical / Trunk Assessment: Back Surgery ?  ?Communication Communication ?Communication: No difficulties ?  ?Cognition Arousal/Alertness: Awake/alert ?Behavior During Therapy: [facility] for tasks assessed/performed ?Overall Cognitive Status: Within Functional Limits for tasks assessed ?  ?  ?  ?  ?  ?  ?  ?  ?  ?  ?  ?  ?  ?  ?  ?  ?  ?  ?  ?General Comments  VSS on RA ? ?  ?Exercises   ?  ?Shoulder Instructions    ? ? ?Home Living Family/patient expects to be discharged to:: Private residence ?Living Arrangements: Spouse/significant other ?Available Help at Discharge: Family;Available 24 hours/day (wife is a Charity fundraiser) ?Type of Home: House ?Home Access: Stairs to enter ?Entrance Stairs-Number of Steps: 4-5 ?Entrance Stairs-Rails: None ?Home Layout: Two level;Bed/bath upstairs ?  ?  ?Bathroom Shower/Tub: Walk-in shower ?  ?Bathroom Toilet: Handicapped height ?Bathroom Accessibility: Yes ?  ?Home Equipment: Agricultural consultant (2 wheels);Cane - quad;Shower seat - built in ?  ?Additional Comments: also has reacher and sock aid for LB dressing ?  ? ?  ?Prior Functioning/Environment Prior Level of Function : Independent/Modified Independent ?  ?  ?  ?  ?  ?  ?Mobility Comments: no AD use ?ADLs Comments: uses AE ?  ? ?  ?  ?OT Problem List: Decreased strength;Decreased range of motion;Decreased activity tolerance;Impaired balance (sitting and/or standing) ?  ?   ?OT Treatment/Interventions: Self-care/ADL training;Therapeutic exercise;Therapeutic activities;Patient/family education;Balance training;DME and/or AE instruction  ?  ?OT Goals(Current goals can be found in the care plan section) Acute Rehab OT Goals ?Patient Stated Goal: none stated ?OT Goal Formulation: With patient ?Time For Goal Achievement:  05/04/22 ?Potential to Achieve Goals: Good  ?OT Frequency: Min 2X/week ?  ? ?Co-evaluation   ?  ?  ?  ?  ? ?  ?AM-PAC OT "6 Clicks" Daily Activity     ?Outcome Measure Help from another person eating meals?: None ?Help from another person taking care of personal grooming?: None ?Help from another person toileting, which includes using toliet, bedpan, or urinal?: A Little ?Help from another person bathing (including washing, rinsing, drying)?: A Little ?Help from another person to put on and taking off regular upper body clothing?: None ?Help from another person to put on and taking off regular lower body clothing?: A Lot ?6 Click Score: 20 ?  ?End of Session Nurse Communication: Mobility status ? ?Activity Tolerance: Patient tolerated treatment well ?Patient left: in bed;with call bell/phone within reach ? ?OT Visit Diagnosis: Unsteadiness on feet (R26.81);Other abnormalities of gait and mobility (R26.89);Muscle weakness (generalized) (M62.81)  ?              ?Time: 0093-8182 ?OT Time Calculation (min): 15 min ?Charges:  OT General Charges ?$OT Visit: 1 Visit ?OT Evaluation ?$OT Eval Low Complexity: 1 Low ? ?Alfonzo Beers, OTD, OTR/L ?Acute Rehab ?[phone] ? ?Mayer Masker ?04/20/2022, 9:05 AM ?

## 2022-04-20 NOTE — Discharge Summary (Signed)
Physician Discharge Summary  ?Patient ID: ?Jason Bernard ?MRN: [id number] ?DOB/AGE: 70-09-1951 70 y.o. ? ?Admit date: 04/19/2022 ?Discharge date: 04/20/2022 ? ?Admission Diagnoses: Lumbar spondylolisthesis L2-L3, lumbar stenosis, myelopathy.  Diabetes mellitus. ? ?Discharge Diagnoses: Lumbar spondylolisthesis L2-L3.  Lumbar stenosis.  Myelopathy.  Lumbar radiculopathy.  Diabetes mellitus. ?Principal Problem: ?  Lumbar stenosis with neurogenic claudication ? ? ?Discharged Condition: good ? ?Hospital Course: Patient was admitted to undergo surgical decompression arthrodesis at the level of L2-L3 tolerated surgery well. ? ?Consults: None ? ?Significant Diagnostic Studies: None ? ?Treatments: surgery: See op note ? ?Discharge Exam: ?Blood pressure 110/62, pulse 94, temperature 98.6 ?F (37 ?C), temperature source Oral, resp. rate 16, height 5\' 9"  (1.753 m), weight 109.9 kg, SpO2 93 %. ?Incision is clean and dry motor function is intact.  Station and gait are intact.  Patient has a significant spastic gait with chronic myelopathy ? ?Disposition: Discharge disposition: 01-Home or Self Care ? ? ? ? ? ? ?Discharge Instructions   ? ? Call MD for:  redness, tenderness, or signs of infection (pain, swelling, redness, odor or green/yellow discharge around incision site)   Complete by: As directed ?  ? Call MD for:  severe uncontrolled pain   Complete by: As directed ?  ? Call MD for:  temperature >100.4   Complete by: As directed ?  ? Diet - low sodium heart healthy   Complete by: As directed ?  ? Discharge wound care:   Complete by: As directed ?  ? Okay to shower. Do not apply salves or appointments to incision. No heavy lifting with the upper extremities greater than 10 pounds. May resume driving when not requiring pain medication and patient feels comfortable with doing so.  ? Incentive spirometry RT   Complete by: As directed ?  ? Increase activity slowly   Complete by: As directed ?  ? ?  ? ?Allergies as of 04/20/2022   ? ?    Reactions  ? Ciprofloxacin Hcl Hives  ? Bee Venom Swelling  ? Hands & facial swelling/ throat tightens  ? Clindamycin/lincomycin Itching, Swelling, Rash  ? swelling in throat  ? Penicillins Swelling, Rash  ? Swelling throat  ? ?  ? ?  ?Medication List  ?  ? ?TAKE these medications   ? ?Basaglar KwikPen 100 UNIT/ML ?Inject 70 Units into the skin at bedtime. ?  ?CANNABIDIOL PO ?Place 1,000 mg under the tongue at bedtime. 1 dropperful (CBD oIl) ?  ?celecoxib 100 MG capsule ?Commonly known as: CELEBREX ?Take 100 mg by mouth 2 (two) times daily. ?  ?COQ10 PO ?Take 500 mg by mouth daily after supper. ?  ?[employer] TRIPLE MAGNESIUM COMPLEX PO ?Take 1 capsule by mouth daily after supper. ?  ?cyclobenzaprine 10 MG tablet ?Commonly known as: FLEXERIL ?Take 1 tablet (10 mg total) by mouth 3 (three) times daily as needed for muscle spasms. ?  ?doxycycline 100 MG capsule ?Commonly known as: VIBRAMYCIN ?Take 100 mg by mouth 2 (two) times daily. ?  ?EPINEPHrine 0.15 MG/0.15ML injection ?Commonly known as: ADRENACLICK ?Inject 0.15 mg into the muscle as needed for anaphylaxis. ?  ?Fruit & Vegetable Daily Caps ?Take 1 capsule by mouth daily. [employer] ?  ?lisinopril 10 MG tablet ?Commonly known as: ZESTRIL ?Take 10 mg by mouth at bedtime. ?  ?moxifloxacin 400 MG tablet ?Commonly known as: Avelox ?Take 1 tablet (400 mg total) by mouth daily for 14 days. ?  ?oxyCODONE-acetaminophen 5-325 MG tablet ?Commonly known as: PERCOCET/ROXICET ?Take  1-2 tablets by mouth every 4 (four) hours as needed for severe pain. ?  ?pramipexole 0.5 MG tablet ?Commonly known as: MIRAPEX ?Take 0.5 mg by mouth 3 (three) times daily as needed for spasms. ?  ?pregabalin 100 MG capsule ?Commonly known as: LYRICA ?Take 100 mg by mouth 3 (three) times daily as needed (spasms). ?  ?rosuvastatin 20 MG tablet ?Commonly known as: CRESTOR ?Take 20 mg by mouth at bedtime. ?  ?tamsulosin 0.4 MG Caps capsule ?Commonly known as: FLOMAX ?Take 0.4 mg by mouth at  bedtime. ?  ?testosterone cypionate 200 MG/ML injection ?Commonly known as: DEPOTESTOSTERONE CYPIONATE ?Inject 200 mg into the muscle every 14 (fourteen) days. ?  ?traZODone 50 MG tablet ?Commonly known as: DESYREL ?Take 100 mg by mouth at bedtime. ?  ?Vitamin D3 125 MCG (5000 UT) Tabs ?Take 5,000 Units by mouth daily after supper. ?  ? ?  ? ?  ?  ? ? ?  ?Discharge Care Instructions  ?(From admission, onward)  ?  ? ? ?  ? ?  Start     Ordered  ? 04/20/22 0000  Discharge wound care:       ?Comments: Okay to shower. Do not apply salves or appointments to incision. No heavy lifting with the upper extremities greater than 10 pounds. May resume driving when not requiring pain medication and patient feels comfortable with doing so.  ? 04/20/22 1255  ? ?  ?  ? ?  ? ? Follow-up Information   ? ? [facility] Follow up.   ?Contact information: ?[address] ?[place] [address] ?[phone] ? ? ?  ?  ? ?  ?  ? ?  ? ? ?Signed: ?Stefani Dama ?04/20/2022, 12:55 PM ? ? ?

## 2022-04-20 NOTE — Progress Notes (Incomplete)
Inpatient Diabetes Program Recommendations ? ?AACE/ADA: New Consensus Statement on Inpatient Glycemic Control (2015) ? ?Target Ranges:  Prepandial:   less than 140 mg/dL ?     Peak postprandial:   less than 180 mg/dL (1-2 hours) ?     Critically ill patients:  140 - 180 mg/dL  ? ?Lab Results  ?Component Value Date  ? GLUCAP 146 (H) 04/20/2022  ? HGBA1C 7.5 (H) 04/09/2022  ? ? ?Review of Glycemic Control ? Latest Reference Range & Units 04/19/22 13:07 04/19/22 15:43 04/19/22 21:22 04/20/22 06:06  ?Glucose-Capillary 70 - 99 mg/dL 388 (H) 875 (H) 797 (H) 146 (H)  ? ?Diabetes history: DM 2 ?Outpatient Diabetes medications:  ?Basaglar 70 units q HS ?Current orders for Inpatient glycemic control:  ?Novolog 0-20 units tid with meals ?Semglee 70 units daily ? ?Inpatient Diabetes Program Recommendations:   ? ?Agree with current orders.  Fasting blood sugar improved this AM.  ?Will follow.  ? ?Thanks,  ?Beryl Meager, RN, BC-ADM ?Inpatient Diabetes Coordinator ?Pager [phone]  (8a-5p)  ? ? ? ?

## 2022-04-20 NOTE — Evaluation (Signed)
Physical Therapy Evaluation and Discharge ? ?Patient Details ?Name: Jason Bernard ?MRN: [id number] ?DOB: 11/12/1951 ?Today's Date: 04/20/2022 ? ?History of Present Illness ? Pt is a [age] y/o M s/p L2-3 PLIF. PMH includes L3-4 decompressoin and fusion.  ?Clinical Impression ? Patient evaluated by Physical Therapy with no further acute PT needs identified. All education has been completed and the patient has no further questions. Pt was able to demonstrate transfers with modified independence and ambulation with supervision for safety. Pt negotiated stairs as he has been doing at home - ascending forwards, and descending backwards. He was able to maintain precautions well. Pt was educated on precautions, brace application/wearing schedule, appropriate activity progression, and car transfer. See below for any follow-up Physical Therapy or equipment needs. PT is signing off. Thank you for this referral.    ?   ? ?Recommendations for follow up therapy are one component of a multi-disciplinary discharge planning process, led by the attending physician.  Recommendations may be updated based on patient status, additional functional criteria and insurance authorization. ? ?Follow Up Recommendations No PT follow up ? ?  ?Assistance Recommended at Discharge PRN  ?Patient can return home with the following ? A little help with walking and/or transfers;A little help with bathing/dressing/bathroom;Assistance with cooking/housework;Assist for transportation;Help with stairs or ramp for entrance ? ?  ?Equipment Recommendations None recommended by PT  ?Recommendations for Other Services ?    ?  ?Functional Status Assessment Patient has had a recent decline in their functional status and demonstrates the ability to make significant improvements in function in a reasonable and predictable amount of time.  ? ?  ?Precautions / Restrictions Precautions ?Precautions: Back ?Precaution Booklet Issued: Yes (comment) ?Precaution Comments:  educated pt on 3/3 back precautions ?Required Braces or Orthoses: Spinal Brace ?Spinal Brace: Applied in sitting position ?Restrictions ?Weight Bearing Restrictions: No  ? ?  ? ?Mobility ? Bed Mobility ?Overal bed mobility: Modified Independent ?  ?  ?  ?  ?  ?  ?General bed mobility comments: HOB flat and rails lowered to simulate home environment. No assist required and good log roll technique. ?  ? ?Transfers ?Overall transfer level: Modified independent ?Equipment used: None ?Transfers: Sit to/from Stand ?  ?  ?  ?  ?  ?  ?General transfer comment: Increased time but good hand placement on seated surface for safety, and no unsteadiness/LOB noted. ?  ? ?Ambulation/Gait ?Ambulation/Gait assistance: Supervision ?Gait Distance (Feet): 300 Feet ?Assistive device: None ?Gait Pattern/deviations: Step-through pattern, Decreased stride length, Knee flexed in stance - left, Knee flexed in stance - right, Trunk flexed ?Gait velocity: Decreased ?Gait velocity interpretation: 1.31 - 2.62 ft/sec, indicative of limited community ambulator ?  ?General Gait Details: Slow and guarded with flexed trunk and decreased floor clearance (baseline). No LOB noted. Pt occasionally touching to wall or hand rail in hallway for support but no assist required. ? ?Stairs ?Stairs: Yes ?Stairs assistance: Supervision, Min guard ?Stair Management: One rail Right, Alternating pattern, Forwards, Backwards ?Number of Stairs: 10 ?General stair comments: Pt ascended stairs forwards and descended stairs backwards as he typically does at home. No assist required. Close guard for safety progressing to supervision by end of stair training. ? ?Wheelchair Mobility ?  ? ?Modified Rankin (Stroke Patients Only) ?  ? ?  ? ?Balance Overall balance assessment: Mild deficits observed, not formally tested ?  ?  ?  ?  ?  ?  ?  ?  ?  ?  ?  ?  ?  ?  ?  ?  ?  ?  ?   ? ? ? ?  Pertinent Vitals/Pain Pain Assessment ?Pain Assessment: Faces ?Faces Pain Scale: Hurts a little  bit ?Pain Location: back ?Pain Descriptors / Indicators: Discomfort, Constant ?Pain Intervention(s): Limited activity within patient's tolerance, Monitored during session, Repositioned  ? ? ?Home Living Family/patient expects to be discharged to:: Private residence ?Living Arrangements: Spouse/significant other ?Available Help at Discharge: Family;Available 24 hours/day (wife is a Charity fundraiser) ?Type of Home: House ?Home Access: Stairs to enter ?Entrance Stairs-Rails: Right ?Entrance Stairs-Number of Steps: flight from basement ?Alternate Level Stairs-Number of Steps: flight ?Home Layout: Two level;Bed/bath upstairs ?Home Equipment: Agricultural consultant (2 wheels);Cane - quad;Shower seat - built in ?Additional Comments: also has reacher and sock aid for LB dressing  ?  ?Prior Function Prior Level of Function : Independent/Modified Independent ?  ?  ?  ?  ?  ?  ?Mobility Comments: no AD use ?ADLs Comments: uses AE ?  ? ? ?Hand Dominance  ? Dominant Hand: Left ? ?  ?Extremity/Trunk Assessment  ? Upper Extremity Assessment ?Upper Extremity Assessment: Defer to OT evaluation ?  ? ?Lower Extremity Assessment ?Lower Extremity Assessment: Generalized weakness (Consistent with pre-op diagnosis) ?  ? ?Cervical / Trunk Assessment ?Cervical / Trunk Assessment: Back Surgery  ?Communication  ? Communication: No difficulties  ?Cognition Arousal/Alertness: Awake/alert ?Behavior During Therapy: [facility] for tasks assessed/performed ?Overall Cognitive Status: Within Functional Limits for tasks assessed ?  ?  ?  ?  ?  ?  ?  ?  ?  ?  ?  ?  ?  ?  ?  ?  ?  ?  ?  ? ?  ?General Comments General comments (skin integrity, edema, etc.): VSS on RA ? ?  ?Exercises    ? ?Assessment/Plan  ?  ?PT Assessment Patient does not need any further PT services  ?PT Problem List   ? ?   ?  ?PT Treatment Interventions     ? ?PT Goals (Current goals can be found in the Care Plan section)  ?Acute Rehab PT Goals ?Patient Stated Goal: Home today, be able to take care of his wife  by the time she has her planned surgery ?PT Goal Formulation: All assessment and education complete, DC therapy ? ?  ?Frequency   ?  ? ? ?Co-evaluation   ?  ?  ?  ?  ? ? ?  ?AM-PAC PT "6 Clicks" Mobility  ?Outcome Measure Help needed turning from your back to your side while in a flat bed without using bedrails?: None ?Help needed moving from lying on your back to sitting on the side of a flat bed without using bedrails?: None ?Help needed moving to and from a bed to a chair (including a wheelchair)?: None ?Help needed standing up from a chair using your arms (e.g., wheelchair or bedside chair)?: None ?Help needed to walk in hospital room?: A Little ?Help needed climbing 3-5 steps with a railing? : A Little ?6 Click Score: 22 ? ?  ?End of Session Equipment Utilized During Treatment: Back brace ?Activity Tolerance: Patient tolerated treatment well ?Patient left: in bed;with call bell/phone within reach ?Nurse Communication: Mobility status ?PT Visit Diagnosis: Unsteadiness on feet (R26.81);Pain ?Pain - part of body:  (back) ?  ? ?Time: 0919-8022 ?PT Time Calculation (min) (ACUTE ONLY): 25 min ? ? ?Charges:   PT Evaluation ?$PT Eval Low Complexity: 1 Low ?PT Treatments ?$Gait Training: 8-22 mins ?  ?   ? ? ?Conni Slipper, PT, DPT ?Acute Rehabilitation Services ?Secure Chat Preferred ?Office: [phone]  ? ?Vernona Rieger  D Marypat Kimmet ?04/20/2022, 11:00 AM ? ?

## 2022-06-25 ENCOUNTER — Other Ambulatory Visit: Payer: Self-pay | Admitting: Neurological Surgery

## 2022-06-25 DIAGNOSIS — M48062 Spinal stenosis, lumbar region with neurogenic claudication: Secondary | ICD-10-CM

## 2022-06-29 ENCOUNTER — Other Ambulatory Visit: Payer: Medicare Other

## 2022-06-30 ENCOUNTER — Ambulatory Visit (INDEPENDENT_AMBULATORY_CARE_PROVIDER_SITE_OTHER): Payer: Medicare Other

## 2022-06-30 DIAGNOSIS — M48062 Spinal stenosis, lumbar region with neurogenic claudication: Secondary | ICD-10-CM

## 2022-08-25 ENCOUNTER — Other Ambulatory Visit: Payer: Self-pay | Admitting: Neurological Surgery

## 2022-08-25 DIAGNOSIS — M5416 Radiculopathy, lumbar region: Secondary | ICD-10-CM

## 2023-05-13 ENCOUNTER — Other Ambulatory Visit: Payer: Self-pay | Admitting: Neurological Surgery

## 2023-05-13 DIAGNOSIS — M4316 Spondylolisthesis, lumbar region: Secondary | ICD-10-CM

## 2023-05-24 ENCOUNTER — Other Ambulatory Visit: Payer: Medicare Other

## 2023-05-25 ENCOUNTER — Ambulatory Visit: Payer: Medicare Other

## 2023-05-25 DIAGNOSIS — M4316 Spondylolisthesis, lumbar region: Secondary | ICD-10-CM | POA: Diagnosis not present
